# Patient Record
Sex: Female | Born: 1955 | Hispanic: Yes | Marital: Single | Smoking: Never smoker
Health system: Southern US, Community
[De-identification: ages and names within clinical notes are randomized; demographics above are authoritative.]

## PROBLEM LIST (undated history)

## (undated) DIAGNOSIS — E119 Type 2 diabetes mellitus without complications: Secondary | ICD-10-CM

## (undated) DIAGNOSIS — E739 Lactose intolerance, unspecified: Secondary | ICD-10-CM

## (undated) DIAGNOSIS — E785 Hyperlipidemia, unspecified: Secondary | ICD-10-CM

## (undated) DIAGNOSIS — I10 Essential (primary) hypertension: Secondary | ICD-10-CM

## (undated) HISTORY — DX: Type 2 diabetes mellitus without complications: E11.9

## (undated) HISTORY — DX: Essential (primary) hypertension: I10

## (undated) HISTORY — DX: Lactose intolerance, unspecified: E73.9

---

## 2010-09-03 ENCOUNTER — Ambulatory Visit: Payer: Self-pay | Admitting: Family Medicine

## 2011-12-17 ENCOUNTER — Other Ambulatory Visit: Payer: Self-pay | Admitting: Family Medicine

## 2011-12-17 LAB — COMPREHENSIVE METABOLIC PANEL
Albumin: 3.8 g/dL (ref 3.4–5.0)
Alkaline Phosphatase: 89 U/L (ref 50–136)
Co2: 28 mmol/L (ref 21–32)
Creatinine: 0.54 mg/dL — ABNORMAL LOW (ref 0.60–1.30)
EGFR (African American): >60
Osmolality: 279 (ref 275–301)
Potassium: 4 mmol/L (ref 3.5–5.1)
SGOT(AST): 26 U/L (ref 15–37)
Sodium: 142 mmol/L (ref 136–145)
Total Protein: 7.6 g/dL (ref 6.4–8.2)

## 2011-12-17 LAB — CBC WITH DIFFERENTIAL/PLATELET
Basophil #: 0.1 10*3/uL (ref 0.0–0.1)
Basophil %: 1.1 %
Eosinophil #: 0.2 10*3/uL (ref 0.0–0.7)
Eosinophil %: 2.8 %
HCT: 41.5 % (ref 35.0–47.0)
Lymphocyte #: 2.9 10*3/uL (ref 1.0–3.6)
MCH: 28.3 pg (ref 26.0–34.0)
MCV: 86 fL (ref 80–100)
Monocyte #: 0.3 x10 3/mm (ref 0.2–0.9)
Monocyte %: 5.7 %
Neutrophil #: 2.4 10*3/uL (ref 1.4–6.5)
Neutrophil %: 40.9 %
Platelet: 310 10*3/uL (ref 150–440)
RBC: 4.81 10*6/uL (ref 3.80–5.20)
RDW: 14.3 % (ref 11.5–14.5)
WBC: 5.8 10*3/uL (ref 3.6–11.0)

## 2011-12-17 LAB — TSH: Thyroid Stimulating Horm: 0.99 u[IU]/mL

## 2012-03-22 ENCOUNTER — Ambulatory Visit: Payer: Self-pay | Admitting: Unknown Physician Specialty

## 2013-08-27 ENCOUNTER — Ambulatory Visit: Payer: Self-pay | Admitting: Physician Assistant

## 2016-12-07 ENCOUNTER — Ambulatory Visit: Payer: Self-pay | Admitting: Adult Health Nurse Practitioner

## 2016-12-07 VITALS — BP 119/70 | HR 70 | Ht 63.0 in | Wt 179.2 lb

## 2016-12-07 DIAGNOSIS — Z0000 Encounter for general adult medical examination without abnormal findings: Secondary | ICD-10-CM

## 2016-12-07 NOTE — Progress Notes (Signed)
   Subjective:    Patient ID: Anne Floyd, female    DOB: Jul 29, 1955, 61 y.o.   MRN: [id number]  HPI   New pt apt. She was  in the ED for L abdominal pain in Oct 2017. Dr diagnosed as Lactose intolerant.  Denies any hx of HTN, DM, HLD.    There are no active problems to display for this patient.  Allergies as of 12/07/2016   No Known Allergies     Medication List       Accurate as of 12/07/16  6:37 PM. Always use your most recent med list.          multivitamin tablet Take 1 tablet by mouth daily.        Review of Systems  All other systems reviewed and are negative.      Objective:   Physical Exam  Constitutional: She is oriented to person, place, and time. She appears well-developed and well-nourished.  HENT:  Head: Normocephalic and atraumatic.  Eyes: Pupils are equal, round, and reactive to light.  Neck: Normal range of motion. Neck supple.  Cardiovascular: Normal rate, regular rhythm and normal heart sounds.   Pulmonary/Chest: Effort normal and breath sounds normal.  Abdominal: Soft. Bowel sounds are normal.  Neurological: She is alert and oriented to person, place, and time.  Skin: Skin is warm and dry.    BP 119/70 (BP Location: Left Arm, Patient Position: Sitting, Cuff Size: Large)   Pulse 70   Ht _0  (1.6 m)   Wt 179 lb 3.2 oz (81.3 kg)   LMP 12/08/2011 (Approximate)   BMI 31.74 kg/m        Assessment & Floyd:   Labs today: CBC, C Met, TSH, A1C, Lipids  F/u PRN

## 2016-12-08 LAB — TSH: TSH: 1.3 u[IU]/mL (ref 0.450–4.500)

## 2016-12-08 LAB — COMPREHENSIVE METABOLIC PANEL
ALK PHOS: 72 IU/L (ref 39–117)
ALT: 34 IU/L — ABNORMAL HIGH (ref 0–32)
AST: 22 IU/L (ref 0–40)
Albumin/Globulin Ratio: 1.6 (ref 1.2–2.2)
Albumin: 4.2 g/dL (ref 3.6–4.8)
BUN/Creatinine Ratio: 15 (ref 12–28)
BUN: 9 mg/dL (ref 8–27)
Bilirubin Total: 0.3 mg/dL (ref 0.0–1.2)
CO2: 21 mmol/L (ref 20–29)
Calcium: 9.4 mg/dL (ref 8.7–10.3)
Chloride: 107 mmol/L — ABNORMAL HIGH (ref 96–106)
Creatinine, Ser: 0.6 mg/dL (ref 0.57–1.00)
GFR calc Af Amer: 115 mL/min/{1.73_m2} (ref >59)
GFR calc non Af Amer: 99 mL/min/{1.73_m2} (ref >59)
GLUCOSE: 100 mg/dL — AB (ref 65–99)
Globulin, Total: 2.6 g/dL (ref 1.5–4.5)
Potassium: 4.3 mmol/L (ref 3.5–5.2)
Sodium: 144 mmol/L (ref 134–144)
Total Protein: 6.8 g/dL (ref 6.0–8.5)

## 2016-12-08 LAB — LIPID PANEL
CHOLESTEROL TOTAL: 205 mg/dL — AB (ref 100–199)
Chol/HDL Ratio: 4.8 ratio — ABNORMAL HIGH (ref 0.0–4.4)
HDL: 43 mg/dL (ref >39)
LDL Calculated: 103 mg/dL — ABNORMAL HIGH (ref 0–99)
Triglycerides: 294 mg/dL — ABNORMAL HIGH (ref 0–149)
VLDL Cholesterol Cal: 59 mg/dL — ABNORMAL HIGH (ref 5–40)

## 2016-12-08 LAB — HEMOGLOBIN A1C
Est. average glucose Bld gHb Est-mCnc: 123 mg/dL
HEMOGLOBIN A1C: 5.9 % — AB (ref 4.8–5.6)

## 2016-12-08 LAB — CBC
Hematocrit: 39 % (ref 34.0–46.6)
Hemoglobin: 13.3 g/dL (ref 11.1–15.9)
MCH: 28.8 pg (ref 26.6–33.0)
MCHC: 34.1 g/dL (ref 31.5–35.7)
MCV: 84 fL (ref 79–97)
Platelets: 282 10*3/uL (ref 150–379)
RBC: 4.62 x10E6/uL (ref 3.77–5.28)
RDW: 15.2 % (ref 12.3–15.4)
WBC: 6.4 10*3/uL (ref 3.4–10.8)

## 2017-11-16 ENCOUNTER — Encounter: Payer: Self-pay | Admitting: Internal Medicine

## 2017-11-16 ENCOUNTER — Ambulatory Visit: Payer: Medicaid Other | Admitting: Internal Medicine

## 2017-11-16 VITALS — BP 142/78 | HR 71 | Temp 98.2°F | Wt 177.4 lb

## 2017-11-16 DIAGNOSIS — Z0000 Encounter for general adult medical examination without abnormal findings: Secondary | ICD-10-CM

## 2017-11-16 NOTE — Progress Notes (Signed)
   Subjective:    Patient ID: Anne Floyd, female    DOB: [birth year], 62 y.o.   MRN: [id number]  HPI   Pt is here with a chief complaint of concern for retaining water. She notices that her BP is also high when she is retaining fluids. When she uses a tissue, there is water that comes up (nose and mouth). She experiences a lot of swelling in her ankles and does not sleep well (around 5 hours normally).   She has experienced swelling in her legs when she was pregnant in the past. She is not sure why this has been happening recently.   She has had some stress since she started school.     Allergies as of 11/16/2017   No Known Allergies     Medication List        Accurate as of 11/16/17  9:33 AM. Always use your most recent med list.          multivitamin tablet Take 1 tablet by mouth daily.      There are no active problems to display for this patient.   Review of Systems     Objective:   Physical Exam  Constitutional: She is oriented to person, place, and time.  Cardiovascular: Normal rate, regular rhythm and normal heart sounds.  Pulmonary/Chest: Effort normal and breath sounds normal.  Neurological: She is alert and oriented to person, place, and time.   BP (!) 142/78 (BP Location: Left Arm, Patient Position: Sitting)   Pulse 71   Temp 98.2 F (36.8 C) (Oral)   Wt 177 lb 6.4 oz (80.5 kg)   BMI 31.42 kg/m    No fluid/rales in the lungs. Thyroid appears normal. Circulation in legs is normal (no edema or fluid present at this time). No extra fluid under the skin.     Assessment & Plan:   Order labs for today. Pt's symptoms may be suggestive of stress. Advised pt that extra salt in the diet affects fluid retention.   No f/u appt scheduled at this time.

## 2017-11-17 LAB — SPECIMEN STATUS REPORT

## 2017-11-19 LAB — SPECIMEN STATUS REPORT

## 2017-11-19 LAB — SEDIMENTATION RATE: Sed Rate: 2 mm/hr (ref 0–40)

## 2018-01-05 ENCOUNTER — Telehealth: Payer: Self-pay

## 2018-01-05 NOTE — Telephone Encounter (Signed)
Spoke with pt. Explained there was an error with the labs. Asked pt to come back in for repeat labs. Pt did not want to schedule for today. She will call back.

## 2018-02-21 ENCOUNTER — Other Ambulatory Visit: Payer: Medicaid Other

## 2018-02-22 LAB — COMPREHENSIVE METABOLIC PANEL
ALT: 29 IU/L (ref 0–32)
AST: 21 IU/L (ref 0–40)
Albumin/Globulin Ratio: 1.5 (ref 1.2–2.2)
Albumin: 4.2 g/dL (ref 3.6–4.8)
Alkaline Phosphatase: 75 IU/L (ref 39–117)
BUN / CREAT RATIO: 20 (ref 12–28)
BUN: 13 mg/dL (ref 8–27)
CHLORIDE: 106 mmol/L (ref 96–106)
CO2: 22 mmol/L (ref 20–29)
CREATININE: 0.65 mg/dL (ref 0.57–1.00)
Calcium: 9.6 mg/dL (ref 8.7–10.3)
GFR calc Af Amer: 111 mL/min/{1.73_m2} (ref >59)
GFR calc non Af Amer: 96 mL/min/{1.73_m2} (ref >59)
GLUCOSE: 142 mg/dL — AB (ref 65–99)
Globulin, Total: 2.8 g/dL (ref 1.5–4.5)
Potassium: 4.2 mmol/L (ref 3.5–5.2)
Sodium: 143 mmol/L (ref 134–144)
Total Protein: 7 g/dL (ref 6.0–8.5)

## 2018-02-22 LAB — CBC
HEMOGLOBIN: 13.3 g/dL (ref 11.1–15.9)
Hematocrit: 42 % (ref 34.0–46.6)
MCH: 28.1 pg (ref 26.6–33.0)
MCHC: 31.7 g/dL (ref 31.5–35.7)
MCV: 89 fL (ref 79–97)
PLATELETS: 351 10*3/uL (ref 150–450)
RBC: 4.73 x10E6/uL (ref 3.77–5.28)
RDW: 14.7 % (ref 12.3–15.4)
WBC: 7 10*3/uL (ref 3.4–10.8)

## 2018-02-22 LAB — URINALYSIS
Bilirubin, UA: NEGATIVE
GLUCOSE, UA: NEGATIVE
KETONES UA: NEGATIVE
LEUKOCYTES UA: NEGATIVE
NITRITE UA: NEGATIVE
PROTEIN UA: NEGATIVE
RBC UA: NEGATIVE
SPEC GRAV UA: 1.009 (ref 1.005–1.030)
Urobilinogen, Ur: 0.2 mg/dL (ref 0.2–1.0)
pH, UA: 7 (ref 5.0–7.5)

## 2018-02-22 LAB — LIPID PANEL
CHOLESTEROL TOTAL: 195 mg/dL (ref 100–199)
Chol/HDL Ratio: 3.8 ratio (ref 0.0–4.4)
HDL: 51 mg/dL (ref >39)
LDL CALC: 118 mg/dL — AB (ref 0–99)
TRIGLYCERIDES: 132 mg/dL (ref 0–149)
VLDL CHOLESTEROL CAL: 26 mg/dL (ref 5–40)

## 2018-02-22 LAB — TSH: TSH: 1 u[IU]/mL (ref 0.450–4.500)

## 2018-04-18 ENCOUNTER — Ambulatory Visit: Payer: Medicaid Other | Admitting: Adult Health Nurse Practitioner

## 2018-04-18 DIAGNOSIS — M5442 Lumbago with sciatica, left side: Principal | ICD-10-CM

## 2018-04-18 DIAGNOSIS — G8929 Other chronic pain: Secondary | ICD-10-CM

## 2018-04-18 DIAGNOSIS — M545 Low back pain, unspecified: Secondary | ICD-10-CM | POA: Insufficient documentation

## 2018-04-18 NOTE — Progress Notes (Signed)
° °  Subjective:    Patient ID: Anne Floyd, female    DOB: 1955/12/28, 62 y.o.   MRN: [id number]  HPI Anne Floyd is a 62 y.o. female who is here with an interpreter. She states that she's been complaining of sciatic back pain that radiates to her left leg and foot. The pain started 12 years ago when she was diagnosed with nerve impingement, and was treated with exercise. The pain was tolerable and well managed until 3 months ago when she lifted a heavy item, the pain worsened again and was 11/10 in severity and shooting in nature with the radiation to left leg and foot. She states that she still ties to exercise to relief the pain, and the pain has improved to 4/10 now. She states that when the pain worsens, she notices weakness and has to lean on a wall to walk. She also noticed bilateral calf cramps 3 weeks ago, that have been constant on the left leg and increasing in frequency in the right leg. She denies using any painkillers. She denied hematuria or hematochezia or melena. She states that she sometimes experiences epigastric discomfort and acid reflux after eating fatty or spicy foods. Her epigastric discomfort doesn't radiate to her back, when though she sometimes experiences right side pain that she relates to her left leg pain. She reports having high blood pressure that was previously treated with medications given by a cardiologist, who later stopped the medications and recommended aspirin only due to BP fluctuations that causes symptomatic hypotension. She is not currently on any BP medications.   Review of Systems As above    Objective:   Physical Exam General: alert, awake and comfortable MSK: No ROM restriction of back (+) antalgic gait (+) walks with support (+) less muscle bulk/wasting of left leg (+) weakness in LL        Assessment & Plan:  1. Sciatic Back Pain that Radiates to Left Leg -I advised her to continue exercising and start OTC  ibuprofen for her pain. -I advised her to monitor her BP and epigastric discomfort.    This case was discussed and reviewed with NP Doles-Johnson

## 2018-06-06 ENCOUNTER — Ambulatory Visit: Payer: Medicaid Other

## 2019-04-27 ENCOUNTER — Encounter: Payer: Self-pay | Admitting: Emergency Medicine

## 2019-04-27 ENCOUNTER — Other Ambulatory Visit: Payer: Self-pay

## 2019-04-27 ENCOUNTER — Emergency Department
Admission: EM | Admit: 2019-04-27 | Discharge: 2019-04-27 | Disposition: A | Discharge status: Home / Self Care | Payer: Self-pay | Attending: Emergency Medicine | Admitting: Emergency Medicine

## 2019-04-27 DIAGNOSIS — M545 Low back pain, unspecified: Secondary | ICD-10-CM

## 2019-04-27 DIAGNOSIS — I10 Essential (primary) hypertension: Secondary | ICD-10-CM | POA: Insufficient documentation

## 2019-04-27 DIAGNOSIS — G8929 Other chronic pain: Secondary | ICD-10-CM | POA: Insufficient documentation

## 2019-04-27 LAB — URINALYSIS, COMPLETE (UACMP) WITH MICROSCOPIC
Bacteria, UA: NONE SEEN
Bilirubin Urine: NEGATIVE
Glucose, UA: NEGATIVE mg/dL
Hgb urine dipstick: NEGATIVE
Ketones, ur: NEGATIVE mg/dL
Nitrite: NEGATIVE
Protein, ur: NEGATIVE mg/dL
Specific Gravity, Urine: 1.021 (ref 1.005–1.030)
pH: 6 (ref 5.0–8.0)

## 2019-04-27 LAB — CBC
HCT: 40.6 % (ref 36.0–46.0)
Hemoglobin: 13.7 g/dL (ref 12.0–15.0)
MCH: 29 pg (ref 26.0–34.0)
MCHC: 33.7 g/dL (ref 30.0–36.0)
MCV: 85.8 fL (ref 80.0–100.0)
Platelets: 310 10*3/uL (ref 150–400)
RBC: 4.73 MIL/uL (ref 3.87–5.11)
RDW: 14.3 % (ref 11.5–15.5)
WBC: 6.2 10*3/uL (ref 4.0–10.5)
nRBC: 0 % (ref 0.0–0.2)

## 2019-04-27 LAB — TSH: TSH: 0.807 u[IU]/mL (ref 0.350–4.500)

## 2019-04-27 LAB — BASIC METABOLIC PANEL
Anion gap: 9 (ref 5–15)
BUN: 13 mg/dL (ref 8–23)
CO2: 23 mmol/L (ref 22–32)
Calcium: 9.2 mg/dL (ref 8.9–10.3)
Chloride: 107 mmol/L (ref 98–111)
Creatinine, Ser: 0.63 mg/dL (ref 0.44–1.00)
GFR calc Af Amer: >60 mL/min (ref >60)
GFR calc non Af Amer: >60 mL/min (ref >60)
Glucose, Bld: 131 mg/dL — ABNORMAL HIGH (ref 70–99)
Potassium: 3.7 mmol/L (ref 3.5–5.1)
Sodium: 139 mmol/L (ref 135–145)

## 2019-04-27 LAB — T4, FREE: Free T4: 0.79 ng/dL (ref 0.61–1.12)

## 2019-04-27 MED ORDER — NAPROXEN 500 MG PO TABS: 500.0000 mg | ORAL_TABLET | Freq: Two times a day (BID) | ORAL | 0 refills | Status: DC

## 2019-04-27 NOTE — ED Provider Notes (Signed)
[facility] Emergency Department Provider Note  ____________________________________________  Time seen: Approximately 6:23 PM  I have reviewed the triage vital signs and the nursing notes.   HISTORY  Chief Complaint Flank Pain  Encounter completed with Spanish interpreter Rafael at bedside  HPI Anne Floyd is a 63 y.o. female With a past medical history of hypertension who comes to the ED complaining of right flank pain for the past 4 days.  She notes this has been going on for several years but worse in the last few days.  Worse with movement, no alleviating factors.  Nonradiating.  Denies dysuria frequency urgency or hematuria.  She believes that this is due to to a kidney problem.  Her only medication is propranolol which she takes for blood pressure, prescribed by a doctor that she had previously seen in [place].  She also complains of diffuse body swelling.  Denies weight changes, hot or cold intolerance, significant hair loss.  Does not currently have a primary care doctor.      Past Medical History:  Diagnosis Date  . Hypertension   . Lactose intolerance      Patient Active Problem List   Diagnosis Date Noted  . Low back pain 04/18/2018     Past Surgical History:  Procedure Laterality Date  . CESAREAN SECTION     x 5     Prior to Admission medications   Medication Sig Start Date End Date Taking? Authorizing Provider  Multiple Vitamin (MULTIVITAMIN) tablet Take 1 tablet by mouth daily.    [provider]  naproxen (NAPROSYN) 500 MG tablet Take 1 tablet (500 mg total) by mouth 2 (two) times daily with a meal. 04/27/19   Carrie Mew, MD     Allergies Patient has no known allergies.   Family History  Problem Relation Age of Onset  . Heart attack Mother   . Stroke Other   . Hypertension Other     Social History Social History   Tobacco Use  . Smoking status: Never Smoker  . Smokeless tobacco:  Never Used  Substance Use Topics  . Alcohol use: No  . Drug use: No    Review of Systems  Constitutional:   No fever or chills.  ENT:   No sore throat. No rhinorrhea. Cardiovascular:   No chest pain or syncope. Respiratory:   No dyspnea or cough. Gastrointestinal:   Negative for abdominal pain, vomiting and diarrhea.  Musculoskeletal:   Right low back pain as above All other systems reviewed and are negative except as documented above in ROS and HPI.  ____________________________________________   PHYSICAL EXAM:  VITAL SIGNS: ED Triage Vitals  Enc Vitals Group     BP 04/27/19 1430 (!) 126/95     Pulse Rate 04/27/19 1430 64     Resp 04/27/19 1430 16     Temp 04/27/19 1430 98.2 F (36.8 C)     Temp Source 04/27/19 1430 Oral     SpO2 04/27/19 1430 98 %     Weight 04/27/19 1431 180 lb (81.6 kg)     Height 04/27/19 1431 5\' 5"  (1.651 m)     Head Circumference --      Peak Flow --      Pain Score 04/27/19 1430 8     Pain Loc --      Pain Edu? --      Excl. in [place]? --     Vital signs reviewed, nursing assessments reviewed.   Constitutional:  Alert and oriented. Non-toxic appearance. Eyes:   Conjunctivae are normal. EOMI. PERRL. ENT      Head:   Normocephalic and atraumatic.      Nose:   Wearing a mask.      Mouth/Throat:   Wearing a mask.      Neck:   No meningismus. Full ROM.  There is a 2 cm mobile mass in the left lower anterior neck in the thyroid area, nontender, no inflammatory changes.  (Patient reports that this was biopsied 5 years ago and was un concerning.) Hematological/Lymphatic/Immunilogical:   No cervical lymphadenopathy. Cardiovascular:   RRR. Symmetric bilateral radial and DP pulses.  No murmurs. Cap refill less than 2 seconds. Respiratory:   Normal respiratory effort without tachypnea/retractions. Breath sounds are clear and equal bilaterally. No wheezes/rales/rhonchi. Gastrointestinal:   Soft and nontender. Non distended. There is no CVA tenderness.   No rebound, rigidity, or guarding.  Musculoskeletal:   Normal range of motion in all extremities. No joint effusions.  No lower extremity tenderness.  No edema.  Mild tenderness of the right low back musculature.  Rotation of the thorax to the left reproduces her symptoms. Neurologic:   Normal speech and language.  Motor grossly intact. No acute focal neurologic deficits are appreciated.  Skin:    Skin is warm, dry and intact. No rash noted.  No petechiae, purpura, or bullae.  ____________________________________________    LABS (pertinent positives/negatives) (all labs ordered are listed, but only abnormal results are displayed) Labs Reviewed  URINALYSIS, COMPLETE (UACMP) WITH MICROSCOPIC - Abnormal; Notable for the following components:      Result Value   Color, Urine YELLOW (*)    APPearance CLEAR (*)    Leukocytes,Ua TRACE (*)    All other components within normal limits  BASIC METABOLIC PANEL - Abnormal; Notable for the following components:   Glucose, Bld 131 (*)    All other components within normal limits  CBC  T4, FREE  TSH   ____________________________________________   EKG    ____________________________________________    RADIOLOGY  No results found.  ____________________________________________   PROCEDURES Procedures  ____________________________________________    CLINICAL IMPRESSION / ASSESSMENT AND PLAN / ED COURSE  Medications ordered in the ED: Medications - No data to display  Pertinent labs & imaging results that were available during my care of the patient were reviewed by me and considered in my medical decision making (see chart for details).  Anne Floyd was evaluated in Emergency Department on 04/27/2019 for the symptoms described in the history of present illness. She was evaluated in the context of the global COVID-19 pandemic, which necessitated consideration that the patient might be at risk for infection with  the SARS-CoV-2 virus that causes COVID-19. Institutional protocols and algorithms that pertain to the evaluation of patients at risk for COVID-19 are in a state of rapid change based on information released by regulatory bodies including the CDC and federal and state organizations. These policies and algorithms were followed during the patient's care in the ED.   Patient presents with chronic right low back pain, likely musculoskeletal.  Vital signs are normal, labs are normal including thyroid studies. 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  Recommended she obtain primary care follow-up.  NSAIDs for now for pain relief.      ____________________________________________   FINAL CLINICAL IMPRESSION(S) / ED DIAGNOSES    Final diagnoses:  Chronic right-sided low back pain  without sciatica     ED Discharge Orders         Ordered    naproxen (NAPROSYN) 500 MG tablet  2 times daily with meals     04/27/19 1822          Portions of this note were generated with dragon dictation software. Dictation errors may occur despite best attempts at proofreading.   Sharman Cheek, MD 04/27/19 [phone]

## 2019-04-27 NOTE — ED Triage Notes (Signed)
Pt arrives with complaints of right flank pain that started Monday. Pt reports the pain as a sharp pain. No difficulty with urination. Pt also reports nausea.

## 2019-04-27 NOTE — Discharge Instructions (Addendum)
Your lab tests today (including kidney function, electrolytes, blood counts, thyroid function, and urine test) were all normal.  Please follow up with a primary care doctor for continued evaluation of your symptoms.

## 2019-09-25 ENCOUNTER — Other Ambulatory Visit: Payer: Self-pay

## 2019-09-25 ENCOUNTER — Ambulatory Visit: Payer: Medicaid Other | Admitting: Gerontology

## 2019-09-25 ENCOUNTER — Encounter: Payer: Self-pay | Admitting: Gerontology

## 2019-09-25 VITALS — BP 137/77 | HR 71 | Wt 183.0 lb

## 2019-09-25 DIAGNOSIS — S025XXA Fracture of tooth (traumatic), initial encounter for closed fracture: Secondary | ICD-10-CM | POA: Insufficient documentation

## 2019-09-25 DIAGNOSIS — Z0000 Encounter for general adult medical examination without abnormal findings: Secondary | ICD-10-CM
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
## 2019-09-26 ENCOUNTER — Other Ambulatory Visit: Payer: Medicaid Other

## 2019-09-26 ENCOUNTER — Other Ambulatory Visit: Payer: Self-pay

## 2019-09-26 DIAGNOSIS — Z0000 Encounter for general adult medical examination without abnormal findings: Secondary | ICD-10-CM

## 2019-09-27 LAB — LIPID PANEL
Chol/HDL Ratio: 4.7 ratio — ABNORMAL HIGH (ref 0.0–4.4)
Cholesterol, Total: 205 mg/dL — ABNORMAL HIGH (ref 100–199)
HDL: 44 mg/dL (ref >39)
LDL Chol Calc (NIH): 111 mg/dL — ABNORMAL HIGH (ref 0–99)
Triglycerides: 289 mg/dL — ABNORMAL HIGH (ref 0–149)
VLDL Cholesterol Cal: 50 mg/dL — ABNORMAL HIGH (ref 5–40)

## 2019-10-23 ENCOUNTER — Telehealth: Payer: Self-pay | Admitting: Gerontology

## 2019-10-23 NOTE — Telephone Encounter (Signed)
Called pt at 3:54pm and LVM, reminding pt we need to schedule follow up appt - KL

## 2019-11-01 ENCOUNTER — Other Ambulatory Visit: Payer: Self-pay

## 2019-11-01 ENCOUNTER — Ambulatory Visit: Payer: Medicaid Other | Admitting: Gerontology

## 2019-11-01 ENCOUNTER — Encounter: Payer: Self-pay | Admitting: Gerontology

## 2019-11-01 ENCOUNTER — Other Ambulatory Visit: Payer: Medicaid Other

## 2019-11-01 VITALS — BP 119/73 | HR 71 | Temp 97.1°F | Ht 61.0 in | Wt 184.9 lb

## 2019-11-01 DIAGNOSIS — R7303 Prediabetes: Secondary | ICD-10-CM | POA: Insufficient documentation

## 2019-11-01 DIAGNOSIS — I10 Essential (primary) hypertension: Secondary | ICD-10-CM

## 2019-11-01 DIAGNOSIS — S025XXA Fracture of tooth (traumatic), initial encounter for closed fracture: Secondary | ICD-10-CM

## 2019-11-01 LAB — POCT GLYCOSYLATED HEMOGLOBIN (HGB A1C): Hemoglobin A1C: 6.3 % — AB (ref 4.0–5.6)

## 2019-11-01 MED ORDER — CHLORTHALIDONE 25 MG PO TABS: 12.5000 mg | ORAL_TABLET | Freq: Every day | ORAL | 0 refills | Status: DC

## 2019-11-01 MED ORDER — METFORMIN HCL 1000 MG PO TABS: 500.0000 mg | ORAL_TABLET | Freq: Two times a day (BID) | ORAL | 0 refills | Status: DC

## 2019-11-01 NOTE — Patient Instructions (Addendum)
Plan de alimentacin DASH DASH Eating Plan DASH es la sigla en ingls de "Enfoques Alimentarios para Detener la Hipertensin" (Dietary Approaches to Stop Hypertension). El plan de alimentacin DASH ha demostrado bajar la presin arterial elevada (hipertensin). Tambin puede reducir el riesgo de diabetes tipo 2, enfermedad cardaca y accidente cerebrovascular. Este plan tambin puede ayudar a adelgazar. Consejos para seguir este plan  Pautas generales  Evite ingerir ms de 2,300 mg (miligramos) de sal (sodio) por da. Si tiene hipertensin, es posible que necesite reducir la ingesta de sodio a 1,500 mg por da.  Limite el consumo de alcohol a no ms de 1medida por da si es mujer y no est embarazada, y 2medidas por da si es hombre. Una medida equivale a 12oz (355ml) de cerveza, 5oz (148ml) de vino o 1oz (44ml) de bebidas alcohlicas de alta graduacin.  Trabaje con su mdico para mantener un peso saludable o perder peso. Pregntele cul es el peso recomendado para usted.  Realice al menos 30 minutos de ejercicio que haga que se acelere su corazn (ejercicio aerbico) la mayora de los das de la semana. Estas actividades pueden incluir caminar, nadar o andar en bicicleta.  Trabaje con su mdico o especialista en alimentacin y nutricin (nutricionista) para ajustar su plan alimentario a sus necesidades calricas personales. Lectura de las etiquetas de los alimentos   Verifique en las etiquetas de los alimentos, la cantidad de sodio por porcin. Elija alimentos con menos del 5 por ciento del valor diario de sodio. Generalmente, los alimentos con menos de 300 mg de sodio por porcin se encuadran dentro de este plan alimentario.  Para encontrar cereales integrales, busque la palabra "integral" como primera palabra en la lista de ingredientes. De compras  Compre productos en los que en su etiqueta diga: "bajo contenido de sodio" o "sin agregado de sal".  Compre alimentos frescos.  Evite los alimentos enlatados y comidas precocidas o congeladas. Coccin  Evite agregar sal cuando cocine. Use hierbas o aderezos sin sal, en lugar de sal de mesa o sal marina. Consulte al mdico o farmacutico antes de usar sustitutos de la sal.  No fra los alimentos. A la hora de cocinar los alimentos opte por hornearlos, hervirlos, grillarlos y asarlos a la parrilla.  Cocine con aceites cardiosaludables, como oliva, canola, soja o girasol. Planificacin de las comidas  Consuma una dieta equilibrada, que incluya lo siguiente: ? 5o ms porciones de frutas y verduras por da. Trate de que la mitad del plato de cada comida sean frutas y verduras. ? Hasta 6 u 8 porciones de cereales integrales por da. ? Menos de 6 onzas de carne, aves o pescado magros por da. Una porcin de 3 onzas de carne tiene casi el mismo tamao que un mazo de cartas. Un huevo equivale a 1 onza. ? Dos porciones de productos lcteos descremados por da. ? Una porcin de frutos secos, semillas o frijoles 5 veces por semana. ? Grasas cardiosaludables. Las grasas saludables llamadas cidos grasos omega-3 se encuentran en alimentos como semillas de lino y pescados de agua fra, como por ejemplo, sardinas, salmn y caballa.  Limite la cantidad que ingiere de los siguientes alimentos: ? Alimentos enlatados o envasados. ? Alimentos con alto contenido de grasa trans, como alimentos fritos. ? Alimentos con alto contenido de grasa saturada, como carne con grasa. ? Dulces, postres, bebidas azucaradas y otros alimentos con azcar agregada. ? Productos lcteos enteros.  No le agregue sal a los alimentos antes de probarlos.  Trate de comer   al menos 2 comidas vegetarianas por semana.  Consuma ms comida casera y menos de restaurante, de bufs y comida rpida.  Cuando coma en un restaurante, pida que preparen su comida con menos sal o, en lo posible, sin nada de sal. Qu alimentos se recomiendan? Los alimentos enumerados a  continuacin no constituyen una lista completa. Hable con el nutricionista sobre las mejores opciones alimenticias para usted. Cereales Pan de salvado o integral. Pasta de salvado o integral. Arroz integral. Avena. Quinua. Trigo burgol. Cereales integrales y con bajo contenido de sodio. Pan pita. Galletitas de agua con bajo contenido de grasa y sodio. Tortillas de harina integral. Verduras Verduras frescas o congeladas (crudas, al vapor, asadas o grilladas). Jugos de tomate y verduras con bajo contenido de sodio o reducidos en sodio. Salsa y pasta de tomate con bajo contenido de sodio o reducidas en sodio. Verduras enlatadas con bajo contenido de sodio o reducidas en sodio. Frutas Todas las frutas frescas, congeladas o disecadas. Frutas enlatadas en jugo natural (sin agregado de azcar). Carne y otros alimentos proteicos Pollo o pavo sin piel. Carne de pollo o de pavo molida. Cerdo desgrasado. Pescado y mariscos. Claras de huevo. Porotos, guisantes o lentejas secos. Frutos secos, mantequilla de frutos secos y semillas sin sal. Frijoles enlatados sin sal. Cortes de carne vacuna magra, desgrasada. Embutidos magros, con bajo contenido de sodio. Lcteos Leche descremada (1%) o descremada. Quesos sin grasa, con bajo contenido de grasa o descremados. Queso blanco o ricota sin grasa, con bajo contenido de sodio. Yogur semidescremado o descremado. Queso con bajo contenido de grasa y sodio. Grasas y aceites Margarinas untables que no contengan grasas trans. Aceite vegetal. Mayonesa y aderezos para ensaladas livianos o con bajo contenido de grasas (reducidos en sodio). Aceite de canola, crtamo, oliva, soja y girasol. Aguacate. Condimentos y otros alimentos Hierbas. Especias. Mezclas de condimentos sin sal. Palomitas de maz y pretzels sin sal. Dulces con bajo contenido de grasas. Qu alimentos no se recomiendan? Los alimentos enumerados a continuacin no constituyen una lista completa. Hable con el  nutricionista sobre las mejores opciones alimenticias para usted. Cereales Productos de panificacin hechos con grasa, como medialunas, magdalenas y algunos panes. Comidas con arroz o pasta seca listas para usar. Verduras Verduras con crema o fritas. Verduras en salsa de queso. Verduras enlatadas regulares (que no sean con bajo contenido de sodio o reducidas en sodio). Pasta y salsa de tomates enlatadas regulares (que no sean con bajo contenido de sodio o reducidas en sodio). Jugos de tomate y verduras regulares (que no sean con bajo contenido de sodio o reducidos en sodio). Pepinillos. Aceitunas. Frutas Fruta enlatada en almbar liviano o espeso. Frutas cocidas en aceite. Frutas con salsa de crema o manteca. Carne y otros alimentos proteicos Cortes de carne con grasa. Costillas. Carne frita. Tocino. Salchichas. Mortadela y otras carnes procesadas. Salame. Panceta. Perros calientes (hotdogs). Salchicha de cerdo. Frutos secos y semillas con sal. Frijoles enlatados con agregado de sal. Pescado enlatado o ahumado. Huevos enteros o yemas. Pollo o pavo con piel. Lcteos Leche entera o al 2%, crema y mitad leche y mitad crema. Queso crema entero o con toda su grasa. Yogur entero o endulzado. Quesos con toda su grasa. Sustitutos de cremas no lcteas. Coberturas batidas. Quesos para untar y quesos procesados. Grasas y aceites Mantequilla. Margarina en barra. Manteca de cerdo. Materia grasa. Mantequilla clarificada. Grasa de panceta. Aceites tropicales como aceite de coco, palmiste o palma. Condimentos y otros alimentos Palomitas de maz y pretzels con sal. Sal   de cebolla, sal de ajo, sal condimentada, sal de mesa y sal marina. Salsa [place]. Salsa trtara. Salsa barbacoa. Salsa teriyaki. Salsa de soja, incluso la que tiene contenido reducido de [place]. Salsa de carne. Salsas en lata y envasadas. Salsa de pescado. Salsa de [place]. Salsa rosada. Rbano picante envasado. [place]. Mostaza. Saborizantes y  tiernizantes para carne. Caldo en cubitos. Salsa picante y salsa tabasco. Escabeches envasados o ya preparados. Aderezos para tacos prefabricados o envasados. Salsas. Aderezos comunes para ensalada. Dnde encontrar ms informacin:  [place], los Pulmones y Herbalist (National Heart, Lung, and [place]): https://wilson-eaton.com/  Asociacin Estadounidense del Corazn (American Heart Association): www.heart.org Resumen  El plan de alimentacin DASH ha demostrado bajar la presin arterial elevada (hipertensin). Tambin puede reducir [employer] de diabetes tipo 2, enfermedad cardaca y accidente cerebrovascular.  Con el plan de alimentacin DASH, deber limitar el consumo de sal (sodio) a 2,300 mg por da. Si tiene hipertensin, es posible que necesite reducir la ingesta de sodio a 1,500 mg por da.  Cuando siga el plan de alimentacin DASH, trate de comer ms frutas frescas y verduras, cereales integrales, carnes magras, lcteos descremados y grasas cardiosaludables.  Trabaje con su mdico o especialista en alimentacin y nutricin (nutricionista) para ajustar su plan alimentario a sus necesidades calricas personales. Esta informacin no tiene Marine scientist el consejo del mdico. Asegrese de hacerle al mdico cualquier pregunta que tenga. Document Revised: 10/04/2016 Document Reviewed: 10/04/2016 Elsevier Patient Education  [place] de alimentacin para personas con prediabetes Prediabetes Eating Plan La prediabetes es una afeccin que hace que los niveles de azcar en la sangre (glucosa) sean ms altos de lo normal. Esto aumenta el riesgo de tener diabetes. Para prevenir la diabetes, es posible que su mdico le recomiende cambios en la dieta y otros cambios en su estilo de vida que lo ayuden a Scientist, forensic lo siguiente:  Chief Technology Officer los niveles de glucemia.  Mejorar los niveles de [place].  Controlar la presin arterial. El mdico puede  recomendarle que trabaje con un especialista en alimentacin y nutricin (nutricionista) para Higher education careers adviser de comidas ms conveniente para usted. Consejos para seguir este plan: Estilo de vida  Establezca metas para bajar de peso con la ayuda de su equipo de atencin mdica. A la [employer] con prediabetes se les recomienda bajar un 7% de su peso corporal.  Haga ejercicio al menos 70minutos 5das por semana, como mnimo.  Asista a un grupo de apoyo o solicite el apoyo continuo de un consejero de salud mental.  [employer] medicamentos de venta libre y los recetados solamente como se lo haya indicado el mdico. Leer las etiquetas de los alimentos  Lea las etiquetas de los alimentos envasados para controlar la cantidad de grasa, sal (sodio) y azcar que contienen. Evite los alimentos que contengan lo siguiente: ? Grasas saturadas. ? Grasas trans. ? Azcares agregados.  Evite los alimentos que contengan ms de 333miligramos(mg) de sodio por porcin. Limite el consumo diario de sodio a menos de 2300mg  por Training and development officer. De compras  Evite comprar alimentos procesados y preelaborados. Coccin  Cocine con aceite de oliva. No use mantequilla, manteca de cerdo o [employer].  Cocine los alimentos al horno, a la parrilla, asados o hervidos. Evite frerlos. Planificacin de las comidas   Trabaje con el nutricionista para crear un plan de alimentacin que sea adecuado para usted. Esto puede incluir lo siguiente: ? Registro de la cantidad de [employer]  ingiere. Use un registro de alimentos, un cuaderno o una aplicacin mvil para anotar lo que comi en cada comida. ? Uso del ndice glucmico (IG) para planificar las comidas. El ndice [place] con qu rapidez elevar la glucemia un alimento. Elija alimentos con bajo IG. Estos demoran ms en elevar la glucemia.  Considere la posibilidad de seguir Clinical cytogeneticist. Esta dieta incluye lo siguiente: ? Varias porciones  de frutas y verduras frescas por da. ? Pescado al [employer] veces por semana. ? Varias porciones de cereales integrales, frijoles, frutos secos y semillas por da. ? Aceite de Location manager de otras grasas. ? Consumo moderado de alcohol. ? Pequeas cantidades de carnes rojas y lcteos enteros.  Si tiene hipertensin arterial, quizs Secretary/administrator consumo de sodio o seguir una dieta como el plan de alimentacin basado en Enfoques Alimentarios para Detener la Hipertensin (Dietary Approaches to Stop Hypertension, DASH). Este es un plan de alimentacin cuyo objetivo es bajar la hipertensin arterial. Qu alimentos se recomiendan? Es posible que los alimentos incluidos a continuacin no Engineer, drilling. Hable con el nutricionista sobre las mejores opciones alimenticias para usted. Cereales Productos integrales, como panes, galletas, cereales y pastas de salvado o integrales. Avena sin azcar. Trigo burgol. Cebada. Quinua. Arroz integral. Tacos o tortillas de harina de maz o de salvado. Hoover Brunette Deatra James. Espin[place]. Guisantes. Remolachas. Coliflor. Repollo. Brcoli. Zanahorias. Tomates. Calabaza. Christella Noa. Hierbas. Pimienta. Cebollas. Pepinos. Repollitos de Bruselas. Frutas Frutos rojos. Bananas. Manzanas. Naranjas. Uvas. Papaya. Mango. [place]. Kiwi. Pomelo. Cerezas. Carnes y otros alimentos ricos en protenas Mariscos. Carne de ave sin piel. Cortes magros de cerdo y carne de res. Tofu. Huevos. Frutos secos. Frijoles. Lcteos Productos lcteos descremados o semidescremados, como yogur, queso cottage y [place]. [employer] T. Caf. Gaseosas sin azcar o dietticas. Soda. Leche descremada o semidescremada. Productos alternativos a la Vidor, como Artas de soja o de [place]. Grasas y aceites Aceite de Regino Ramirez. Aceite de canola. Aceite de girasol. Aceite de semillas de uva. Aguacate. Nueces. Dulces y postres Pudin sin azcar o con bajo contenido de [place]. Helado y otros postres  congelados sin azcar o con bajo contenido de [place]. Condimentos y otros alimentos Hierbas. Especias sin sodio. Mostaza. Salsa de pepinillos. [place] con bajo contenido de [place] y de International aid/development worker. Salsa barbacoa con bajo contenido de grasa y de azcar. Mayonesa con bajo contenido de grasa o sin grasa. Qu alimentos no se recomiendan? Es posible que los alimentos incluidos a continuacin no Engineer, drilling. Hable con el nutricionista sobre las mejores opciones alimenticias para usted. Cereales Productos elaborados con [place] y [place], como panes, pastas, bocadillos y cereales. Verduras Verduras enlatadas. Verduras congeladas con mantequilla o salsa de crema. [name] Frutas enlatadas al almbar. Carnes y otros alimentos ricos en protenas Cortes de carne con grasa. Carne de ave con piel. Carne empanizada o frita. Carnes procesadas. Lcteos Yogur, queso o [employer] Health. Bebidas Bebidas azucaradas, como t helado dulce y [place]. Grasas y [employer]. Manteca de cerdo. Mantequilla clarificada. Dulces y [employer], como pasteles, pastelitos, galletas dulces y tarta de [place]. Condimentos y otros alimentos Mezclas de especias con sal agregada. [place]. Salsa barbacoa. Mayonesa. Resumen  Para prevenir la diabetes, es posible que Personnel officer en la dieta y otros cambios en su estilo de vida para ayudar a Psychologist, clinical, mejorar los niveles de colesterol y Scientist, physiological presin arterial.  Establezca metas para bajar de peso con la ayuda de [place]  equipo de [employer]. A la [employer] con prediabetes se les recomienda bajar un 7por ciento de su peso corporal.  Considere la posibilidad de seguir una dieta mediterrnea que incluya muchas frutas y verduras frescas, cereales integrales, frijoles, frutos secos, semillas, pescado, carnes magras, lcteos descremados y aceites saludables. Esta informacin no tiene Public house manager el consejo del mdico. Asegrese de hacerle al mdico cualquier pregunta que tenga. Document Revised: 12/27/2016 Document Reviewed: 12/27/2016 Elsevier Patient Education  2020 [employer].
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
## 2019-11-05 LAB — UA/M W/RFLX CULTURE, ROUTINE
Bilirubin, UA: NEGATIVE
Glucose, UA: NEGATIVE
Ketones, UA: NEGATIVE
Nitrite, UA: NEGATIVE
Protein,UA: NEGATIVE
RBC, UA: NEGATIVE
Specific Gravity, UA: 1.005 (ref 1.005–1.030)
Urobilinogen, Ur: 0.2 mg/dL (ref 0.2–1.0)
pH, UA: 7 (ref 5.0–7.5)

## 2019-11-05 LAB — COMPREHENSIVE METABOLIC PANEL
ALT: 30 IU/L (ref 0–32)
AST: 20 IU/L (ref 0–40)
Albumin/Globulin Ratio: 1.5 (ref 1.2–2.2)
Albumin: 4.2 g/dL (ref 3.8–4.8)
Alkaline Phosphatase: 73 IU/L (ref 39–117)
BUN/Creatinine Ratio: 18 (ref 12–28)
BUN: 9 mg/dL (ref 8–27)
Bilirubin Total: 0.3 mg/dL (ref 0.0–1.2)
CO2: 26 mmol/L (ref 20–29)
Calcium: 9.2 mg/dL (ref 8.7–10.3)
Chloride: 106 mmol/L (ref 96–106)
Creatinine, Ser: 0.49 mg/dL — ABNORMAL LOW (ref 0.57–1.00)
GFR calc Af Amer: 120 mL/min/{1.73_m2} (ref >59)
GFR calc non Af Amer: 104 mL/min/{1.73_m2} (ref >59)
Globulin, Total: 2.8 g/dL (ref 1.5–4.5)
Glucose: 84 mg/dL (ref 65–99)
Potassium: 4.6 mmol/L (ref 3.5–5.2)
Sodium: 144 mmol/L (ref 134–144)
Total Protein: 7 g/dL (ref 6.0–8.5)

## 2019-11-05 LAB — URINE CULTURE, REFLEX

## 2019-11-05 LAB — MICROSCOPIC EXAMINATION
Bacteria, UA: NONE SEEN
Casts: NONE SEEN /lpf
RBC, Urine: NONE SEEN /hpf (ref 0–2)

## 2019-11-08 ENCOUNTER — Telehealth: Payer: Self-pay | Admitting: Gerontology

## 2019-11-08 NOTE — Telephone Encounter (Signed)
Called patient to relay lab results.  Patient mentioned symptoms of nausea and feeling generally unwell after taking newly prescribed medication - chlorthalidone and metformin. Patient reported no reoccurrence but was instructed to call back to schedule an appointment if these symptoms persist.

## 2019-11-21 ENCOUNTER — Other Ambulatory Visit: Payer: Medicaid Other

## 2019-11-21 ENCOUNTER — Ambulatory Visit: Payer: Medicaid Other | Admitting: Gerontology

## 2019-12-18 ENCOUNTER — Other Ambulatory Visit: Payer: Self-pay

## 2019-12-18 ENCOUNTER — Ambulatory Visit: Payer: Medicaid Other | Admitting: Pharmacy Technician

## 2019-12-18 DIAGNOSIS — Z79899 Other long term (current) drug therapy: Secondary | ICD-10-CM

## 2019-12-18 NOTE — Progress Notes (Signed)
Patient speaks Spanish.  Interpretation provided by Anderson Malta, ID# [id number] at [facility].  Patient agreed to all terms of the [facility] contract.   Patient approved to receive medication assistance at [facility] until time for re-certification in 6195, and as long as eligibility criteria continues to be met.    [facility]

## 2020-10-16 ENCOUNTER — Other Ambulatory Visit: Payer: Self-pay

## 2020-10-16 ENCOUNTER — Encounter: Payer: Self-pay | Admitting: Gerontology

## 2020-10-16 ENCOUNTER — Ambulatory Visit: Payer: Medicaid Other | Admitting: Gerontology

## 2020-10-16 VITALS — BP 144/80 | HR 63 | Temp 97.7°F | Resp 16 | Ht 63.0 in | Wt 181.2 lb

## 2020-10-16 DIAGNOSIS — K219 Gastro-esophageal reflux disease without esophagitis: Secondary | ICD-10-CM

## 2020-10-16 DIAGNOSIS — I10 Essential (primary) hypertension: Secondary | ICD-10-CM

## 2020-10-16 DIAGNOSIS — G479 Sleep disorder, unspecified: Secondary | ICD-10-CM

## 2020-10-16 DIAGNOSIS — R5383 Other fatigue: Secondary | ICD-10-CM

## 2020-10-16 DIAGNOSIS — R7303 Prediabetes: Secondary | ICD-10-CM

## 2020-10-16 DIAGNOSIS — Z0000 Encounter for general adult medical examination without abnormal findings: Secondary | ICD-10-CM

## 2020-10-16 DIAGNOSIS — E7849 Other hyperlipidemia: Secondary | ICD-10-CM

## 2020-10-16 DIAGNOSIS — E785 Hyperlipidemia, unspecified: Secondary | ICD-10-CM | POA: Insufficient documentation

## 2020-10-16 MED ORDER — LISINOPRIL 5 MG PO TABS
5.0000 mg | ORAL_TABLET | Freq: Every day | ORAL | 0 refills | Status: DC
  Filled 2020-10-16: qty 30, 30d supply, fill #0

## 2020-10-16 MED ORDER — METFORMIN HCL 500 MG PO TABS
500.0000 mg | ORAL_TABLET | Freq: Two times a day (BID) | ORAL | 0 refills | Status: DC
  Filled 2020-10-16: qty 60, 30d supply, fill #0
  Filled 2020-10-16: qty 30, 15d supply, fill #0

## 2020-10-16 MED ORDER — OMEPRAZOLE 20 MG PO CPDR
20.0000 mg | DELAYED_RELEASE_CAPSULE | Freq: Every day | ORAL | 0 refills | Status: DC
  Filled 2020-10-16: qty 30, 30d supply, fill #0

## 2020-10-16 MED ORDER — MELATONIN 3 MG PO TABS
3.0000 mg | ORAL_TABLET | Freq: Every evening | ORAL | 0 refills | Status: DC | PRN
  Filled 2020-10-16: qty 30, 30d supply, fill #0
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
## 2020-10-16 NOTE — Patient Instructions (Addendum)
Enfermedad de reflujo gastroesofgico en los adultos Gastroesophageal Reflux Disease, Adult  El reflujo gastroesofgico (RGE) ocurre cuando el cido del estmago sube por el tubo que conecta la boca con el estmago (esfago). Normalmente, la comida baja por el esfago y se mantiene en el estmago, donde se la digiere. Cuando una persona tiene RGE, los alimentos y el cido estomacal suelen volver al esfago. Usted puede tener una enfermedad llamada enfermedad de reflujo gastroesofgico (ERGE) si el reflujo:  Sucede a menudo.  Causa sntomas frecuentes o muy intensos.  Causa problemas tales como dao en el esfago. Cuando esto ocurre, el esfago duele y se hincha. Con el tiempo, la ERGE puede ocasionar pequeos agujeros (lceras) en el revestimiento del esfago. Cules son las causas? Esta afeccin se debe a un problema en el msculo que se encuentra entre el esfago y [place]. Cuando este msculo est dbil o no es normal, no se cierra correctamente para impedir que los alimentos y el cido regresen del Paramedic. El msculo puede debilitarse debido a lo siguiente:  El consumo de [place].  [place].  Tener cierto tipo de hernia (hernia de hiato).  Consumo de alcohol.  Ciertos alimentos y bebidas, como caf, chocolate, cebollas y [place]. Qu incrementa el riesgo?  Tener sobrepeso.  Tener una enfermedad que afecta el tejido conjuntivo.  Tomar antiinflamatorios no esteroideos (AINE), como el ibuprofeno. Cules son los signos o sntomas?  Acidez estomacal.  Dificultad o dolor al tragar.  Sensacin de Best boy un bulto en la garganta.  Sabor amargo en la boca.  Mal aliento.  Tener una gran cantidad de saliva.  Estmago inflamado o con Tree surgeon.  Eructos.  Dolor en el pecho. El dolor de pecho puede deberse a distintas afecciones. Asegrese de Teacher, adult education a su mdico si tiene Tourist information centre manager.  Dificultad para respirar o sibilancias.  Ardelia Mems tos a largo plazo o tos  nocturna.  Desgaste de la superficie de los dientes (esmalte dental).  Prdida de peso. Cmo se trata?  Realizar cambios en la dieta.  Tomar medicamentos.  Someterse a [place]. El tratamiento depender de la gravedad de los sntomas. Siga estas instrucciones en su casa: Comida y bebida  Siga una dieta como se lo haya indicado el mdico. Es posible que deba evitar alimentos y bebidas, por ejemplo: ? Caf y t negro, con o sin cafena. ? Bebidas que contengan alcohol. ? Bebidas energticas y deportivas. ? Bebidas gaseosas (carbonatadas) y refrescos. ? Chocolate y cacao. ? Menta y [place]. ? Ajo y cebolla. ? Rbano picante. ? Alimentos cidos y condimentados. Estos incluyen todos los tipos de pimientos, [place] en polvo, curry en polvo, vinagre, salsas picantes y [employer]. ? Ctricos y sus jugos, por ejemplo, naranjas, limones y limas. ? Alimentos que [employer]. Estos incluyen salsa roja, [place], salsa picante y pizza con salsa de Sublimity. ? Alimentos fritos y Radio broadcast assistant. Estos incluyen donas, papas fritas, papitas fritas de bolsa y aderezos con alto contenido de [place]. ? Carnes con alto contenido de [place]. Estas incluye los perros calientes, chuletas o costillas, embutidos, jamn y tocino. ? Productos lcteos ricos en grasas, como leche [place], [place] y [place] crema.  Consuma pequeas cantidades de comida con ms frecuencia. Evite consumir porciones abundantes.  Evite beber grandes cantidades de lquidos con las comidas.  Evite comer 2 o 3horas antes de acostarse.  Evite recostarse inmediatamente despus de comer.  No haga ejercicios enseguida despus de comer.   Estilo de vida  No fume ni consuma  ningn producto que contenga nicotina o tabaco. Si necesita ayuda para dejar de consumir estos productos, consulte al American Express.  Intente reducir el nivel de estrs. Si necesita ayuda para hacer esto, consulte al mdico.  Si tiene sobrepeso, baje una cantidad de  peso saludable para usted. Consulte a su mdico para bajar de peso de [employer].   Instrucciones generales  Est atento a cualquier cambio en los sntomas.  Tome los medicamentos de venta libre y los recetados solamente como se lo haya indicado el mdico.  No tome aspirina, ibuprofeno ni otros antiinflamatorios no esteroideos (AINE) a menos que el mdico lo autorice.  Use ropa holgada. No use nada apretado alrededor de la cintura.  Levante (eleve) la cabecera de la cama aproximadamente 6pulgadas (15cm). Para hacerlo, es posible que tenga que utilizar una cua.  Evite inclinarse si al hacerlo empeoran los sntomas.  Cumpla con todas las visitas de seguimiento. Comunquese con un mdico si:  Aparecen nuevos sntomas.  Adelgaza y no sabe por qu.  Tiene problemas para tragar o le duele cuando traga.  Tiene sibilancias o tos persistente.  Tiene la voz ronca.  Los sntomas no mejoran con Scientist, research (medical). Solicite ayuda de inmediato si:  Electronics engineer repentino [employer], el cuello, la [place], los dientes o la espalda.  De repente se siente transpirado, mareado o aturdido.  Siente falta de aire o Journalist, newspaper.  Vomita y el vmito es de color verde, amarillo o negro, o tiene un aspecto similar a la sangre o a los posos de caf.  Se desmaya.  Las heces (deposiciones) son rojas, sanguinolentas o negras.  No puede tragar, beber o comer. Estos sntomas pueden representar un problema grave que constituye Radio broadcast assistant. No espere a ver si los sntomas desaparecen. Solicite atencin mdica de inmediato. Comunquese con el servicio de emergencias de su localidad (911 en los [place]). No conduzca por sus propios medios [employer]. Resumen  Si una persona tiene enfermedad de reflujo gastroesofgico (ERGE), los alimentos y el cido estomacal suben al esfago y causan sntomas o problemas tales como dao en el esfago.  El tratamiento depender de la  gravedad de los sntomas.  Siga una dieta como se lo haya indicado el mdico.  Use todos los medicamentos solamente como se lo haya indicado el mdico. Esta informacin no tiene Theme park manager el consejo del mdico. Asegrese de hacerle al mdico cualquier pregunta que tenga. Document Revised: 01/25/2020 Document Reviewed: 01/25/2020 Elsevier Patient Education  2021 Elsevier Inc. [email or web address]">  Plan de alimentacin DASH DASH Eating Plan DASH es la sigla en ingls de "Enfoques Alimentarios para Detener la Hipertensin". El plan de alimentacin DASH ha demostrado:  Bajar la presin arterial elevada (hipertensin).  Reducir el riesgo de diabetes tipo 2, enfermedad cardaca y accidente cerebrovascular.  Ayudar a perder peso. Consejos para seguir Consulting civil engineer las etiquetas de los alimentos  Verifique la cantidad de sal (sodio) por porcin en las etiquetas de los alimentos. Elija alimentos con menos del 5 por ciento del valor diario de sodio. Generalmente, los alimentos con menos de 300 miligramos (mg) de sodio por porcin se encuadran dentro de este plan alimentario.  Para encontrar cereales integrales, busque la palabra "integral" como primera palabra en la lista de ingredientes. Al ir de compras  Compre productos en los que en su etiqueta diga: "bajo contenido de sodio" o "sin agregado de sal".  Compre alimentos frescos. Evite los alimentos enlatados y comidas precocidas o congeladas.  Al cocinar  Evite agregar sal cuando cocine. Use hierbas o aderezos sin sal, en lugar de sal de mesa o sal marina. Consulte al mdico o farmacutico antes de usar sustitutos de la sal.  No fra los alimentos. A la hora de cocinar los alimentos opte por hornearlos, hervirlos, grillarlos, asarlos al horno y asarlos a Patent attorney.  Cocine con aceites cardiosaludables, como oliva, canola, aguacate, soja o girasol. Planificacin de las  comidas  Consuma una dieta equilibrada, que incluya lo siguiente: ? 4o ms porciones de frutas y 4 o ms porciones de Warehouse manager. Trate de que medio plato de cada comida sea de frutas y verduras. ? De 6 a 8porciones de cereales [employer]. ? Menos de 6 onzas (170g) de carne, aves o pescado Copy. Una porcin de 3 onzas (85g) de carne tiene casi el mismo tamao que un mazo de cartas. Un huevo equivale a 1 onza (28g). ? De 2 a 3 porciones de productos lcteos descremados por da. Una porcin es 1taza ( ). ? 1 porcin de frutos secos, semillas o frijoles 5 veces por semana. ? De 2 a 3 porciones de grasas cardiosaludables. Las grasas saludables llamadas cidos grasos omega-3 se encuentran en alimentos como las nueces, las semillas de [place], las leches fortificadas y [place]. Estas grasas tambin se encuentran en los pescados de agua fra, como la sardina, el salmn y la caballa.  Limite la cantidad que consume de: ? Alimentos enlatados o envasados. ? Alimentos con alto contenido de grasa trans, como algunos alimentos fritos. ? Alimentos con alto contenido de grasa saturada, como carne con grasa. ? Postres y otros dulces, bebidas azucaradas y otros alimentos con azcar agregada. ? Productos lcteos enteros.  No le agregue sal a los alimentos antes de probarlos.  No coma ms de 4 yemas de huevo por semana.  Trate de comer al menos 2 comidas vegetarianas por semana.  Consuma ms comida casera y menos de restaurante, de bares y comida rpida.   Estilo de vida  Cuando coma en un restaurante, pida que preparen su comida con menos sal o, en lo posible, sin nada de sal.  Si bebe alcohol: ? Limite la cantidad que bebe:  De 0 a 1 medida por da para las mujeres que no estn embarazadas.  De 0 a 2 medidas por da para los hombres. ? Est atento a la cantidad de alcohol que hay en las bebidas que toma. En los [place], una medida equivale a una  botella de cerveza de 12oz ( ), un vaso de vino de 5oz ( ) o un vaso de una bebida alcohlica de alta graduacin de 1oz (44ml). Informacin general  Evite ingerir ms de 2300 mg de sal por da. Si tiene hipertensin, es posible que necesite reducir la ingesta de sodio a 1,500 mg por da.  Trabaje con su mdico para mantener un peso saludable o perder [employer]. Pregntele cul es el peso recomendado para usted.  Realice al menos 30 minutos de ejercicio que haga que se acelere su corazn (ejercicio Magazine features editor) la [employer]V de la [place]. Estas actividades pueden incluir caminar, nadar o andar en bicicleta.  Trabaje con su mdico o nutricionista para ajustar su plan alimentario a sus necesidades calricas personales. Qu alimentos debo comer? Frutas Todas las frutas frescas, congeladas o disecadas. Frutas enlatadas en jugo natural (sin agregado de azcar). Verduras Verduras frescas o congeladas (crudas, al vapor, asadas o grilladas). Jugos de tomate y verduras  con bajo contenido de sodio o reducidos en sodio. Salsa y pasta de tomate con bajo contenido de sodio o reducidas en sodio. Verduras enlatadas con bajo contenido de sodio o reducidas en sodio. Granos Pan de salvado o integral. Pasta de salvado o integral. Arroz integral. Avena. Quinua. Trigo burgol. Cereales integrales y con bajo contenido de [place]. Pan pita. Galletitas de [place] con bajo contenido de [place] (the [place] of 60 deg S) y [place]. Tortillas de [place] integral. Carnes y otras protenas Pollo o pavo sin piel. Carne de pollo o de [place]. Cerdo desgrasado. Pescado y [employer]. Claras de huevo. Porotos, guisantes o lentejas secos. Frutos secos, mantequilla de frutos secos y semillas sin sal. Frijoles enlatados sin sal. Cortes de carne vacuna magra, desgrasada. Carne precocida o curada magra y baja en sodio, como embutidos o panes de carne. Lcteos Leche descremada (1%) o descremada. Quesos reducidos en grasa, con bajo contenido de grasa o  descremados. Queso blanco o ricota sin grasa, con bajo contenido de [place]. Yogur semidescremado o descremado. Queso con bajo contenido de [place]the [place] of 60 deg [place]) y [place]. Grasas y [employer] untables que no contengan grasas trans. Aceite vegetal. Jerolyn Shin y aderezos para ensaladas livianos, reducidos en grasa o con bajo contenido de grasas (reducidos en sodio). Aceite de canola, crtamo, oliva, aguacate, soja y [place]. Aguacate. Alios y condimentos Hierbas. Especias. Mezclas de condimentos sin sal. Otros alimentos Palomitas de maz y pretzels sin sal. Dulces con bajo contenido de grasas. Es posible que los productos que se enumeran ms [place] no constituyan una lista completa de los alimentos y las bebidas que puede tomar. Consulte a un nutricionista para obtener ms informacin. Qu alimentos debo evitar? [name] Fruta enlatada en almbar liviano o espeso. Frutas cocidas en aceite. Frutas con salsa de crema o mantequilla. Verduras Verduras con crema o fritas. Verduras en salsa de [place]. Verduras enlatadas regulares (que no sean con bajo contenido de sodio o reducidas en sodio). Pasta y salsa de tomates enlatadas regulares (que no sean con bajo contenido de sodio o reducidas en sodio). Jugos de tomate y verduras regulares (que no sean con bajo contenido de sodio o reducidos en sodio). Pepinillos. Aceitunas. Granos Productos de panificacin hechos con grasa, como medialunas, magdalenas y algunos panes. Comidas con arroz o pasta seca listas para usar. Carnes y [address] de carne con alto contenido de Holiday representative. Costillas. Carne frita. [place]. [place], salame y otras carnes precocidas o curadas, como embutidos o panes de carne. Grasa de la espalda del cerdo (panceta). Salchicha de cerdo. Frutos secos y semillas con sal. Frijoles enlatados con agregado de sal. Pescado enlatado o ahumado. Huevos enteros o yemas. Pollo o pavo con piel. Lcteos Leche entera o al 2%, crema y [address] y mitad crema.  Queso crema entero o con toda su grasa. Yogur entero o endulzado. Quesos con toda su grasa. Sustitutos de cremas no lcteas. Coberturas batidas. Quesos para untar y quesos procesados. Grasas y [employer]. Margarina en barra. Manteca de cerdo. Lardo. Mantequilla clarificada. Grasa de panceta. Aceites tropicales como aceite de coco, palmiste o palma. Alios y condimentos Sal de cebolla, sal de ajo, sal condimentada, sal de mesa y sal marina. Salsa [place]shire. Salsa trtara. Salsa barbacoa. Salsa teriyaki. Salsa de soja, incluso la que tiene contenido reducido de [place]. Salsa de carne. Salsas en lata y envasadas. Salsa de pescado. Salsa de [place]. Salsa rosada. Rbanos picantes comprados en tiendas. [place]. [place]. Saborizantes y tiernizantes para carne. Caldo en cubitos. Salsas picantes. Adobos preelaborados o envasados. Aderezos para tacos preelaborados o envasados.  Salsas de pepinillos. Aderezos comunes para ensalada. Otros alimentos Palomitas de maz y pretzels con sal. Es posible que los productos que se enumeran ms arriba no constituyan una lista completa de los alimentos y las bebidas que Personnel officer. Consulte a un nutricionista para obtener ms informacin. Dnde buscar ms informacin  National Heart, Lung, and Blood Institute (Instituto Nacional del [place], los Pulmones y Risk manager): PopSteam.is  American Heart Association (Asociacin Estadounidense del Corazn): www.heart.org  Academy of Nutrition and Dietetics (Academia de Nutricin y Pension scheme manager): www.eatright.org  National Kidney Foundation (Fundacin Nacional del Rin): www.kidney.org Resumen  El plan de alimentacin DASH ha demostrado bajar la presin arterial elevada (hipertensin). Tambin puede reducir [employer] International de diabetes tipo 2, enfermedad cardaca y accidente cerebrovascular.  Cuando siga el plan de alimentacin DASH, trate de comer ms frutas frescas y verduras, cereales integrales, carnes magras,  lcteos descremados y grasas cardiosaludables.  Con el plan de alimentacin DASH, deber limitar el consumo de sal (sodio) a 2,300 mg por da. Si tiene hipertensin, es posible que necesite reducir la ingesta de sodio a 1,500 mg por da.  Trabaje con su mdico o nutricionista para ajustar su plan alimentario a sus necesidades calricas personales. Esta informacin no tiene Theme park manager el consejo del mdico. Asegrese de hacerle al mdico cualquier pregunta que tenga. Document Revised: 07/19/2019 Document Reviewed: 07/19/2019 Elsevier Patient Education  2021 Elsevier Inc. https://www.diabeteseducator.org/docs/default-source/living-with-diabetes/conquering-the-grocery-store-v1.pdf?sfvrsn=4">  Recuento de carbohidratos para la diabetes mellitus en los adultos Carbohydrate Counting for Diabetes Mellitus, Adult El recuento de carbohidratos es un mtodo para llevar un registro de la cantidad de carbohidratos que se ingieren. La ingesta natural de carbohidratos aumenta la cantidad de azcar (glucosa) en la sangre. El recuento de la cantidad de carbohidratos que se ingieren mejora el control del nivel de [place], lo que ayuda a Company secretary la diabetes. Es importante saber la cantidad de carbohidratos que se pueden ingerir en cada comida sin correr Surveyor, minerals. Esto es Government social research officer. Un nutricionista puede ayudarlo a crear un plan de alimentacin y a calcular la cantidad de carbohidratos que debe ingerir en cada comida y colacin. Qu alimentos contienen carbohidratos? Los siguientes alimentos incluyen carbohidratos:  Granos, como panes y cereales.  Frijoles secos y productos con soja.  Verduras con almidn, como papas, guisantes y maz.  [name] y jugos de frutas.  Leche y Dentist.  Dulces y colaciones, como pasteles, galletas, caramelos, papas fritas de bolsa y refrescos.   Cmo se calculan los carbohidratos de los alimentos? Hay dos maneras de calcular los carbohidratos de  los alimentos. Puede leer las etiquetas de los alimentos o aprender cules son los tamaos de las porciones estndar de los alimentos. Puede usar cualquiera de [name] o [place] combinacin de [place]. Usar la Air cabin crew de informacin nutricional La lista de informacin nutricional est incluida en las etiquetas de casi todas las bebidas y los alimentos envasados de los [place]. Incluye lo siguiente:  El tamao de la porcin.  Informacin sobre los nutrientes de cada porcin, incluidos los gramos (g) de carbohidratos por porcin. Para usar la informacin nutricional:  Decida cuntas porciones va a comer.  Multiplique la cantidad de porciones por el nmero de carbohidratos por porcin.  El resultado es la cantidad total de carbohidratos que comer. Conocer los tamaos de las porciones estndar de los alimentos Cuando coma alimentos que contengan carbohidratos y que no estn envasados o no incluyan la informacin nutricional en la etiqueta, debe medir las porciones para poder calcular la  cantidad de carbohidratos.  Mida los alimentos que comer con una balanza de alimentos o una taza medidora, si es necesario.  Decida cuntas porciones de [name], systems.  Multiplique el nmero de porciones por15. En los alimentos que contienen carbohidratos, una porcin [place] a 15 g de carbohidratos. ? Por ejemplo, si come 2 tazas o 10onzas (300g) de fresas, habr comido 2porciones y 30g de carbohidratos (2porciones x 15g=30g).  En el caso de las comidas que contienen mezclas de ms de un alimento, como las sopas y los guisos, debe calcular los carbohidratos de cada alimento que se incluye. La siguiente lista contiene los tamaos de porciones estndar de los alimentos ricos en carbohidratos ms comunes. Cada una de estas porciones tiene aproximadamente 15g de carbohidratos:  1rebanada de pan.  1tortilla de seis pulgadas (15cm).  ? de taza o 2onzas (53g) de arroz o pasta  cocidos.   taza o 3 onzas (85 g) de lentejas o frijoles cocidos o enlatados, escurridos y enjuagados.   taza o 3onzas (85g) de verduras con almidn, como guisantes, maz o zapallo.   taza o 4 onzas (120 g) de cereal caliente.   taza o 3 onzas (85 g) de papas hervidas o en pur, o  o 3 onzas (85 g) de una papa grande al horno.   taza o 4 onzas fluidas ( ) de jugo de frutas.  1 taza u 8 onzas fluidas (237 ml) de leche.  1 unidad pequea o 4 onzas (106 g) de manzana.   unidad o 2 onzas (63 g) de una banana mediana.  1 taza o 5 oz (150 g) de fresas.  3 tazas o 1 oz (24g) de palomitas de maz. Cul sera un ejemplo de recuento de carbohidratos? Para calcular el nmero de carbohidratos de este ejemplo de comida, siga los pasos que se describen a continuacin. Ejemplo de comida  3 onzas (85g) de pechugas de pollo.  ? de taza o 4 onzas (106 g) de arroz integral.   taza o 3 onzas (85 g) de maz.  1 taza u 8 onzas fluidas (237 ml) de leche.  1 taza o 5onzas (150g) de fresas con crema batida sin azcar. Clculo de carbohidratos 1. Identifique los alimentos que contienen carbohidratos: ? Arroz. ? Maz. ? Leche. ? Jinny Sanders. 2. Calcule cuntas porciones come de cada alimento: ? 2 porciones de arroz. ? 1 porcin de maz. ? 1 porcin de leche. ? 1 porcin de fresas. 3. Multiplique cada nmero de porciones por 15g: ? 2 porciones de arroz x 15 g = 30 g. ? 1 porcin de maz x 15 g = 15 g. ? 1 porcin de leche x 15 g = 15 g. ? 1 porcin de fresas x 15 g = 15 g. 4. Sume todas las cantidades para conocer el total de gramos de carbohidratos consumidos: ? 30g + 15g + 15g + 15g = 75g de carbohidratos en total. Consejos para seguir este plan Al ir de compras  Elabore un plan de comidas y luego haga una lista de compras.  Compre verduras frescas y congeladas, frutas frescas y congeladas, productos lcteos, huevos, frijoles, lentejas y cereales  integrales.  Fjese en las etiquetas de los alimentos. Elija los alimentos que tengan ms fibra y Neurosurgeon.  Evite los alimentos procesados y los alimentos con [name], clinical. Planificacin de las comidas  Trate de consumir la misma cantidad de carbohidratos en cada comida y en cada colacin.  Planifique tomar comidas y colaciones regulares y equilibradas.  Dnde buscar ms informacin  American Diabetes Association (Asociacin Estadounidense de la Diabetes): www.diabetes.org  Centers for Disease Control and Prevention (Centros para el Control y la Prevencin de Event organisernfermedades): FootballExhibition.com.brwww.cdc.gov Resumen  El recuento de carbohidratos es un mtodo para llevar un registro de la cantidad de carbohidratos que se ingieren.  La ingesta natural de carbohidratos aumenta la cantidad de azcar (glucosa) en la sangre.  El recuento de la cantidad de carbohidratos que se ingieren mejora el control del nivel de Ridgevilleglucemia, lo que ayuda a Company secretarymanejar la diabetes.  Un nutricionista puede ayudarlo a crear un plan de alimentacin y a calcular la cantidad de carbohidratos que debe ingerir en cada comida y colacin. Esta informacin no tiene Theme park managercomo fin reemplazar el consejo del mdico. Asegrese de hacerle al mdico cualquier pregunta que tenga. Document Revised: 07/23/2019 Document Reviewed: 07/23/2019 Elsevier Patient Education  2021 [employer]

## 2020-10-22 ENCOUNTER — Other Ambulatory Visit: Payer: Self-pay

## 2020-10-22 ENCOUNTER — Other Ambulatory Visit: Payer: Medicaid Other

## 2020-10-22 DIAGNOSIS — R7303 Prediabetes: Secondary | ICD-10-CM

## 2020-10-22 DIAGNOSIS — I10 Essential (primary) hypertension: Secondary | ICD-10-CM

## 2020-10-22 DIAGNOSIS — R5383 Other fatigue: Secondary | ICD-10-CM

## 2020-10-22 DIAGNOSIS — Z0000 Encounter for general adult medical examination without abnormal findings: Secondary | ICD-10-CM

## 2020-10-23 LAB — CBC WITH DIFFERENTIAL/PLATELET
Basophils Absolute: 0.1 x10E3/uL (ref 0.0–0.2)
Basos: 1 %
EOS (ABSOLUTE): 0.2 x10E3/uL (ref 0.0–0.4)
Eos: 4 %
Hematocrit: 43.2 % (ref 34.0–46.6)
Hemoglobin: 14.3 g/dL (ref 11.1–15.9)
Immature Grans (Abs): 0 x10E3/uL (ref 0.0–0.1)
Immature Granulocytes: 0 %
Lymphocytes Absolute: 2.3 x10E3/uL (ref 0.7–3.1)
Lymphs: 40 %
MCH: 28.9 pg (ref 26.6–33.0)
MCHC: 33.1 g/dL (ref 31.5–35.7)
MCV: 87 fL (ref 79–97)
Monocytes Absolute: 0.5 x10E3/uL (ref 0.1–0.9)
Monocytes: 8 %
Neutrophils Absolute: 2.8 x10E3/uL (ref 1.4–7.0)
Neutrophils: 47 %
Platelets: 252 x10E3/uL (ref 150–450)
RBC: 4.94 x10E6/uL (ref 3.77–5.28)
RDW: 14.1 % (ref 11.7–15.4)
WBC: 5.9 x10E3/uL (ref 3.4–10.8)

## 2020-10-23 LAB — COMPREHENSIVE METABOLIC PANEL
ALT: 29 IU/L (ref 0–32)
AST: 21 IU/L (ref 0–40)
Albumin/Globulin Ratio: 1.4 (ref 1.2–2.2)
Albumin: 4.1 g/dL (ref 3.8–4.8)
Alkaline Phosphatase: 72 IU/L (ref 44–121)
BUN/Creatinine Ratio: 14 (ref 12–28)
BUN: 9 mg/dL (ref 8–27)
Bilirubin Total: 0.5 mg/dL (ref 0.0–1.2)
CO2: 20 mmol/L (ref 20–29)
Calcium: 8.9 mg/dL (ref 8.7–10.3)
Chloride: 107 mmol/L — ABNORMAL HIGH (ref 96–106)
Creatinine, Ser: 0.63 mg/dL (ref 0.57–1.00)
Globulin, Total: 2.9 g/dL (ref 1.5–4.5)
Glucose: 108 mg/dL — ABNORMAL HIGH (ref 65–99)
Potassium: 4.5 mmol/L (ref 3.5–5.2)
Sodium: 141 mmol/L (ref 134–144)
Total Protein: 7 g/dL (ref 6.0–8.5)
eGFR: 99 mL/min/{1.73_m2} (ref >59)

## 2020-10-23 LAB — LIPID PANEL
Chol/HDL Ratio: 4.1 ratio (ref 0.0–4.4)
Cholesterol, Total: 173 mg/dL (ref 100–199)
HDL: 42 mg/dL (ref >39)
LDL Chol Calc (NIH): 102 mg/dL — ABNORMAL HIGH (ref 0–99)
Triglycerides: 168 mg/dL — ABNORMAL HIGH (ref 0–149)
VLDL Cholesterol Cal: 29 mg/dL (ref 5–40)

## 2020-10-23 LAB — VITAMIN B12: Vitamin B-12: 645 pg/mL (ref 232–1245)

## 2020-10-23 LAB — VITAMIN D 25 HYDROXY (VIT D DEFICIENCY, FRACTURES): Vit D, 25-Hydroxy: 19 ng/mL — ABNORMAL LOW (ref 30.0–100.0)

## 2020-10-23 LAB — HEMOGLOBIN A1C
Est. average glucose Bld gHb Est-mCnc: 143 mg/dL
Hgb A1c MFr Bld: 6.6 % — ABNORMAL HIGH (ref 4.8–5.6)

## 2020-10-23 LAB — TSH: TSH: 1.28 u[IU]/mL (ref 0.450–4.500)

## 2020-11-06 ENCOUNTER — Ambulatory Visit: Payer: Medicaid Other | Admitting: Gerontology

## 2020-11-06 ENCOUNTER — Other Ambulatory Visit: Payer: Self-pay

## 2020-11-06 ENCOUNTER — Encounter: Payer: Self-pay | Admitting: Gerontology

## 2020-11-06 VITALS — BP 119/75 | HR 61 | Temp 97.7°F | Resp 16 | Ht 63.0 in | Wt 180.2 lb

## 2020-11-06 DIAGNOSIS — E559 Vitamin D deficiency, unspecified: Secondary | ICD-10-CM

## 2020-11-06 DIAGNOSIS — I10 Essential (primary) hypertension: Secondary | ICD-10-CM

## 2020-11-06 DIAGNOSIS — E119 Type 2 diabetes mellitus without complications: Secondary | ICD-10-CM

## 2020-11-06 DIAGNOSIS — R7303 Prediabetes: Secondary | ICD-10-CM

## 2020-11-06 DIAGNOSIS — E7849 Other hyperlipidemia: Secondary | ICD-10-CM

## 2020-11-06 MED ORDER — VITAMIN D (ERGOCALCIFEROL) 1.25 MG (50000 UNIT) PO CAPS
50000.0000 [IU] | ORAL_CAPSULE | ORAL | 0 refills | Status: DC
  Filled 2020-11-06: qty 2, 14d supply, fill #0
  Filled 2020-11-20: qty 4, 28d supply, fill #1

## 2020-11-06 MED ORDER — PROPRANOLOL HCL 20 MG PO TABS
20.0000 mg | ORAL_TABLET | Freq: Two times a day (BID) | ORAL | 1 refills | Status: DC
  Filled 2020-11-06: qty 28, 14d supply, fill #0
  Filled 2020-11-20: qty 60, 30d supply, fill #1

## 2020-11-06 MED ORDER — METFORMIN HCL 500 MG PO TABS
500.0000 mg | ORAL_TABLET | Freq: Two times a day (BID) | ORAL | 0 refills | Status: DC
  Filled 2020-11-06 – 2020-11-14 (×2): qty 60, 30d supply, fill #0

## 2020-11-06 MED ORDER — ROSUVASTATIN CALCIUM 5 MG PO TABS
5.0000 mg | ORAL_TABLET | Freq: Every day | ORAL | 0 refills | Status: DC
  Filled 2020-11-06: qty 14, 14d supply, fill #0
  Filled 2020-11-20: qty 16, 16d supply, fill #1
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
## 2020-11-06 NOTE — Patient Instructions (Signed)
Plan de alimentacin cardiosaludable Heart-Healthy Eating Plan La planificacin de las comidas cardiosaludables incluye lo siguiente:  Consumir menos grasas no saludables.  Consumir ms grasas saludables.  Realizar otros cambios en su dieta. Hable con el mdico o con un especialista en alimentacin (nutricionista) para crear un plan de alimentacin que sea adecuado para usted. En qu consiste el plan? El mdico puede recomendarle un plan de alimentacin que incluya lo siguiente:  Grasas totales: ______% o menos del total de caloras por da.  Grasas saturadas: ______% o menos del total de caloras por da.  Colesterol: menos de _________mg por da. Cules son algunos consejos para seguir este plan? Al cocinar Evite frer los alimentos. En cambio, trate de cocinarlos en el horno, en la plancha o en la parrilla, o hervirlos. Tambin puede reducir las grasas de la siguiente forma:  Quite la piel de las aves.  Quite todas las grasas visibles de las carnes.  Cocine al vapor las verduras en agua o caldo. Planificacin de las comidas  En las comidas, divida su plato en cuatro partes iguales: ? Llene la mitad del plato con verduras y ensaladas de hojas verdes. ? Llene un cuarto del plato con cereales integrales. ? Llene un cuarto del plato con alimentos con protenas magras.  Coma 4 o 5porciones de verduras por da. Una porcin de verduras equivale a lo siguiente: ? 1taza de verduras crudas o cocidas. ? 2 tazas de verduras de hojas verdes crudas.  Coma 4 o 5porciones de frutas por da. Una porcin de fruta equivale a lo siguiente: ? 1 fruta mediana entera. ? taza de fruta desecada. ?  taza de fruta fresca, congelada o enlatada. ? taza de jugo 100% de fruta.  Consuma ms alimentos que tengan fibra soluble. Ellos son las manzanas, el brcoli, las zanahorias, los frijoles, los guisantes y la cebada. Trate de consumir de 20a 30g de fibra por da.  Coma 4 o 5porciones  de frutos secos, legumbres y semillas por semana: ? 1 porcin de frijoles o legumbres secos equivale a taza despus de su coccin. ? 1 porcin de frutos secos equivale a  de taza. ? 1 porcin de semillas equivale a una cucharada.   Informacin general  Coma ms comidas caseras. Coma menos comida de restaurantes, bufs y comida rpida.  Limite o evite el alcohol.  Limite los alimentos con alto contenido de almidn y azcar.  Evite las comidas fritas.  Baje de peso si es necesario.  Intente llevar un registro de la cantidad de sal (sodio) que ingiere. Esto es importante si tiene presin arterial alta. Pdale a su mdico que le d ms informacin al respecto.  Trate de incorporar comidas vegetarianas cada semana. Grasas  Elija las grasas saludables. Estas incluyen aceite de oliva y de canola, semillas de lino, nueces, almendras y semillas.  Consuma ms grasas omega-3. Estas incluyen salmn, caballa, sardinas, atn, aceite de lino y semillas de lino molidas. Intente comer pescado al menos dos veces por semana.  Lea las etiquetas de los alimentos. Evite los alimentos que contienen grasas trans o altas cantidades de grasas saturadas.  Limite el consumo de grasas saturadas. ? Estas se encuentran frecuentemente en productos de origen animal, como carnes, mantequilla y crema. ? Tambin se encuentran en alimentos de origen vegetal, como el aceite de palma, el aceite de palmiste y el aceite de coco.  Evite los alimentos con aceites parcialmente hidrogenados. Estos tienen grasas trans. Entre los ejemplos, se incluyen margarinas en barra, algunas margarinas untables,   galletas dulces y saladas y otros productos horneados. Qu alimentos puedo comer? Frutas Frutas frescas, en conserva (en su jugo natural) o frutas congeladas. Verduras Verduras frescas o congeladas (crudas, al vapor, asadas o grilladas). Ensaladas de hojas verdes. Cereales La mayora de los cereales. Elija casi siempre trigo  integral y cereales integrales. Arroz y pastas, incluido el arroz integral y las pastas elaboradas con trigo integral. Carnes y otras protenas Carnes magras de res, ternera, cerdo y cordero a las que se les haya quitado la grasa visible. Pollo y pavo sin piel. Todos los pescados y mariscos. Pato salvaje, conejo, faisn y venado. Claras de huevo o sustitutos del huevo bajos en colesterol. Porotos, guisantes y lentejas secos y tofu. Semillas y la mayora de los frutos secos. Lcteos Quesos descremados y semidescremados, entre ellos, ricota y mozzarella. Leche descremada o al 1% que sea lquida, en polvo o evaporada. Suero de leche elaborado con leche semidescremada. Yogur descremado o semidescremado. Grasas y aceites Margarinas no hidrogenadas (sin grasas trans). Aceites vegetales, incluido el de soja, ssamo, girasol, oliva, man, crtamo, maz, canola y semillas de algodn. Alios para ensalada o mayonesa elaborados con aceite vegetal. Bebidas Agua mineral. T y caf. Gaseosas dietticas. Dulces y postres Sorbete, gelatina y helado de frutas. Pequeas cantidades de chocolate amargo. Limite todos los dulces y postres. Alios y condimentos Todos los alios y condimentos. Es posible que los productos que se enumeran ms arriba no constituyan una lista completa de los alimentos y las bebidas que puede tomar. Consulte a un nutricionista para conocer ms opciones. Qu alimentos debo evitar? Frutas Fruta enlatada en almbar espeso. Frutas con salsa de crema o mantequilla. Frutas cocidas en aceite. Limite el consumo de coco. Verduras Verduras cocinadas con salsas de queso, crema o mantequilla. Verduras fritas. Cereales Panes elaborados con grasas saturadas o trans, aceites o leche entera. Croissants. Panecillos dulces. Rosquillas. Galletas con alto contenido de grasas, como las que contienen queso. Carnes y otras protenas Carnes grasas, como perros calientes, costillas de res, salchichas, tocino,  asado de costillar o chuletn. Fiambres con alto contenido de grasas, como salame y mortadela. Caviar. Pato y ganso domsticos. Vsceras, como hgado. Lcteos Crema, crema agria, queso crema y queso cottage con crema. Quesos enteros. Leche entera o al 2% que sea lquida, evaporada o condensada. Suero de leche entero. Salsa de crema o queso con alto contenido de grasas. Yogur elaborado con leche entera. Grasas y aceites Grasa de carne o materia grasa. Manteca de cacao, aceites hidrogenados, aceite de palma, aceite de coco, aceite de palmiste. Grasas y materias grasas slidas, incluida la grasa del tocino, el cerdo salado, la manteca de cerdo y la mantequilla. Sustitutos no lcteos de la crema. Aderezos para ensalada con queso o crema agria. Bebidas Refrescos regulares y jugos con agregado de azcar. Dulces y postres Glaseados. Pudin. Galletas dulces. Bizcochuelos. Pasteles. Chocolate con leche o chocolate blanco. Almbares con mantequilla. Helados o bebidas elaboradas con helado con alto contenido de grasas. Esta no es una lista completa de los alimentos y las bebidas que se deben evitar. Consulte a un nutricionista para obtener ms informacin. Resumen  La planificacin de las comidas cardiosaludables incluye comer menos grasas poco saludables, comer ms grasas saludables y hacer otros cambios en su dieta.  Siga una dieta equilibrada. Esta incluye frutas y verduras, productos lcteos descremados o semidescremados, protenas magras, frutos secos y legumbres, cereales integrales y aceites y grasas cardiosaludables. Esta informacin no tiene como fin reemplazar el consejo del mdico. Asegrese de hacerle al   mdico cualquier pregunta que tenga. Document Revised: 09/24/2017 Document Reviewed: 09/24/2017 Elsevier Patient Education  2021 Elsevier Inc. https://www.nhlbi.nih.gov/files/docs/public/heart/dash_brief.pdf">  Plan de alimentacin DASH DASH Eating Plan DASH es la sigla en ingls de "Enfoques  Alimentarios para Detener la Hipertensin". El plan de alimentacin DASH ha demostrado:  Bajar la presin arterial elevada (hipertensin).  Reducir el riesgo de diabetes tipo 2, enfermedad cardaca y accidente cerebrovascular.  Ayudar a perder peso. Consejos para seguir este plan Leer las etiquetas de los alimentos  Verifique la cantidad de sal (sodio) por porcin en las etiquetas de los alimentos. Elija alimentos con menos del 5 por ciento del valor diario de sodio. Generalmente, los alimentos con menos de 300 miligramos (mg) de sodio por porcin se encuadran dentro de este plan alimentario.  Para encontrar cereales integrales, busque la palabra "integral" como primera palabra en la lista de ingredientes. Al ir de compras  Compre productos en los que en su etiqueta diga: "bajo contenido de sodio" o "sin agregado de sal".  Compre alimentos frescos. Evite los alimentos enlatados y comidas precocidas o congeladas. Al cocinar  Evite agregar sal cuando cocine. Use hierbas o aderezos sin sal, en lugar de sal de mesa o sal marina. Consulte al mdico o farmacutico antes de usar sustitutos de la sal.  No fra los alimentos. A la hora de cocinar los alimentos opte por hornearlos, hervirlos, grillarlos, asarlos al horno y asarlos a la parrilla.  Cocine con aceites cardiosaludables, como oliva, canola, aguacate, soja o girasol. Planificacin de las comidas  Consuma una dieta equilibrada, que incluya lo siguiente: ? 4o ms porciones de frutas y 4 o ms porciones de verduras por da. Trate de que medio plato de cada comida sea de frutas y verduras. ? De 6 a 8porciones de cereales integrales todos los das. ? Menos de 6 onzas (170g) de carne, aves o pescado magros por da. Una porcin de 3 onzas (85g) de carne tiene casi el mismo tamao que un mazo de cartas. Un huevo equivale a 1 onza (28g). ? De 2 a 3 porciones de productos lcteos descremados por da. Una porcin es 1taza (237ml). ? 1  porcin de frutos secos, semillas o frijoles 5 veces por semana. ? De 2 a 3 porciones de grasas cardiosaludables. Las grasas saludables llamadas cidos grasos omega-3 se encuentran en alimentos como las nueces, las semillas de lino, las leches fortificadas y los huevos. Estas grasas tambin se encuentran en los pescados de agua fra, como la sardina, el salmn y la caballa.  Limite la cantidad que consume de: ? Alimentos enlatados o envasados. ? Alimentos con alto contenido de grasa trans, como algunos alimentos fritos. ? Alimentos con alto contenido de grasa saturada, como carne con grasa. ? Postres y otros dulces, bebidas azucaradas y otros alimentos con azcar agregada. ? Productos lcteos enteros.  No le agregue sal a los alimentos antes de probarlos.  No coma ms de 4 yemas de huevo por semana.  Trate de comer al menos 2 comidas vegetarianas por semana.  Consuma ms comida casera y menos de restaurante, de bares y comida rpida.   Estilo de vida  Cuando coma en un restaurante, pida que preparen su comida con menos sal o, en lo posible, sin nada de sal.  Si bebe alcohol: ? Limite la cantidad que bebe:  De 0 a 1 medida por da para las mujeres que no estn embarazadas.  De 0 a 2 medidas por da para los hombres. ? Est atento a la cantidad   de alcohol que hay en las bebidas que toma. En los [place], una medida equivale a una botella de cerveza de 12oz (355ml), un vaso de vino de 5oz (148ml) o un vaso de una bebida alcohlica de alta graduacin de 1oz (44ml). Informacin general  Evite ingerir ms de 2300 mg de sal por da. Si tiene hipertensin, es posible que necesite reducir la ingesta de sodio a 1,500 mg por da.  Trabaje con su mdico para mantener un peso saludable o perder peso. Pregntele cul es el peso recomendado para usted.  Realice al menos 30 minutos de ejercicio que haga que se acelere su corazn (ejercicio aerbico) la mayora de los das de la semana.  Estas actividades pueden incluir caminar, nadar o andar en bicicleta.  Trabaje con su mdico o nutricionista para ajustar su plan alimentario a sus necesidades calricas personales. Qu alimentos debo comer? Frutas Todas las frutas frescas, congeladas o disecadas. Frutas enlatadas en jugo natural (sin agregado de azcar). Verduras Verduras frescas o congeladas (crudas, al vapor, asadas o grilladas). Jugos de tomate y verduras con bajo contenido de sodio o reducidos en sodio. Salsa y pasta de tomate con bajo contenido de sodio o reducidas en sodio. Verduras enlatadas con bajo contenido de sodio o reducidas en sodio. Granos Pan de salvado o integral. Pasta de salvado o integral. Arroz integral. Avena. Quinua. Trigo burgol. Cereales integrales y con bajo contenido de sodio. Pan pita. Galletitas de agua con bajo contenido de grasa y sodio. Tortillas de harina integral. Carnes y otras protenas Pollo o pavo sin piel. Carne de pollo o de pavo molida. Cerdo desgrasado. Pescado y mariscos. Claras de huevo. Porotos, guisantes o lentejas secos. Frutos secos, mantequilla de frutos secos y semillas sin sal. Frijoles enlatados sin sal. Cortes de carne vacuna magra, desgrasada. Carne precocida o curada magra y baja en sodio, como embutidos o panes de carne. Lcteos Leche descremada (1%) o descremada. Quesos reducidos en grasa, con bajo contenido de grasa o descremados. Queso blanco o ricota sin grasa, con bajo contenido de sodio. Yogur semidescremado o descremado. Queso con bajo contenido de grasa y sodio. Grasas y aceites Margarinas untables que no contengan grasas trans. Aceite vegetal. Mayonesa y aderezos para ensaladas livianos, reducidos en grasa o con bajo contenido de grasas (reducidos en sodio). Aceite de canola, crtamo, oliva, aguacate, soja y girasol. Aguacate. Alios y condimentos Hierbas. Especias. Mezclas de condimentos sin sal. Otros alimentos Palomitas de maz y pretzels sin sal. Dulces con  bajo contenido de grasas. Es posible que los productos que se enumeran ms arriba no constituyan una lista completa de los alimentos y las bebidas que puede tomar. Consulte a un nutricionista para obtener ms informacin. Qu alimentos debo evitar? Frutas Fruta enlatada en almbar liviano o espeso. Frutas cocidas en aceite. Frutas con salsa de crema o mantequilla. Verduras Verduras con crema o fritas. Verduras en salsa de queso. Verduras enlatadas regulares (que no sean con bajo contenido de sodio o reducidas en sodio). Pasta y salsa de tomates enlatadas regulares (que no sean con bajo contenido de sodio o reducidas en sodio). Jugos de tomate y verduras regulares (que no sean con bajo contenido de sodio o reducidos en sodio). Pepinillos. Aceitunas. Granos Productos de panificacin hechos con grasa, como medialunas, magdalenas y algunos panes. Comidas con arroz o pasta seca listas para usar. Carnes y otras protenas Cortes de carne con alto contenido de grasa. Costillas. Carne frita. Tocino. Mortadela, salame y otras carnes precocidas o curadas, como embutidos o panes   de carne. Grasa de la espalda del cerdo (panceta). Salchicha de cerdo. Frutos secos y semillas con sal. Frijoles enlatados con agregado de sal. Pescado enlatado o ahumado. Huevos enteros o yemas. Pollo o pavo con piel. Lcteos Leche entera o al 2%, crema y mitad leche y mitad crema. Queso crema entero o con toda su grasa. Yogur entero o endulzado. Quesos con toda su grasa. Sustitutos de cremas no lcteas. Coberturas batidas. Quesos para untar y quesos procesados. Grasas y aceites Mantequilla. Margarina en barra. Manteca de cerdo. Lardo. Mantequilla clarificada. Grasa de panceta. Aceites tropicales como aceite de coco, palmiste o palma. Alios y condimentos Sal de cebolla, sal de ajo, sal condimentada, sal de mesa y sal marina. Salsa [place]. Salsa trtara. Salsa barbacoa. Salsa teriyaki. Salsa de soja, incluso la que tiene  contenido reducido de sodio. Salsa de carne. Salsas en lata y envasadas. Salsa de pescado. Salsa de ostras. Salsa rosada. Rbanos picantes comprados en tiendas. [place]up. Mostaza. Saborizantes y tiernizantes para carne. Caldo en cubitos. Salsas picantes. Adobos preelaborados o envasados. Aderezos para tacos preelaborados o envasados. Salsas de pepinillos. Aderezos comunes para ensalada. Otros alimentos Palomitas de maz y pretzels con sal. Es posible que los productos que se enumeran ms arriba no constituyan una lista completa de los alimentos y las bebidas que debe evitar. Consulte a un nutricionista para obtener ms informacin. Dnde buscar ms informacin  National Heart, Lung, and Blood Institute (Instituto Nacional del Corazn, los Pulmones y la Sangre): www.nhlbi.nih.gov  American Heart Association (Asociacin Estadounidense del Corazn): www.heart.org  Academy of Nutrition and Dietetics (Academia de Nutricin y Diettica): www.eatright.org  National Kidney Foundation (Fundacin Nacional del Rin): www.kidney.org Resumen  El plan de alimentacin DASH ha demostrado bajar la presin arterial elevada (hipertensin). Tambin puede reducir el riesgo de diabetes tipo 2, enfermedad cardaca y accidente cerebrovascular.  Cuando siga el plan de alimentacin DASH, trate de comer ms frutas frescas y verduras, cereales integrales, carnes magras, lcteos descremados y grasas cardiosaludables.  Con el plan de alimentacin DASH, deber limitar el consumo de sal (sodio) a 2,300 mg por da. Si tiene hipertensin, es posible que necesite reducir la ingesta de sodio a 1,500 mg por da.  Trabaje con su mdico o nutricionista para ajustar su plan alimentario a sus necesidades calricas personales. Esta informacin no tiene como fin reemplazar el consejo del mdico. Asegrese de hacerle al mdico cualquier pregunta que tenga. Document Revised: 07/19/2019 Document Reviewed: 07/19/2019 Elsevier Patient  Education  2021 Elsevier Inc. https://www.diabeteseducator.org/docs/default-source/living-with-diabetes/conquering-the-grocery-store-v1.pdf?sfvrsn=4">  Recuento de carbohidratos para la diabetes mellitus en los adultos Carbohydrate Counting for Diabetes Mellitus, Adult El recuento de carbohidratos es un mtodo para llevar un registro de la cantidad de carbohidratos que se ingieren. La ingesta natural de carbohidratos aumenta la cantidad de azcar (glucosa) en la sangre. El recuento de la cantidad de carbohidratos que se ingieren mejora el control del nivel de glucemia, lo que ayuda a manejar la diabetes. Es importante saber la cantidad de carbohidratos que se pueden ingerir en cada comida sin correr ningn riesgo. Esto es diferente en cada persona. Un nutricionista puede ayudarlo a crear un plan de alimentacin y a calcular la cantidad de carbohidratos que debe ingerir en cada comida y colacin. Qu alimentos contienen carbohidratos? Los siguientes alimentos incluyen carbohidratos:  Granos, como panes y cereales.  Frijoles secos y productos con soja.  Verduras con almidn, como papas, guisantes y maz.  Frutas y jugos de frutas.  Leche y yogur.  Dulces y colaciones, como pasteles, galletas, caramelos, papas fritas   de bolsa y refrescos.   Cmo se calculan los carbohidratos de los alimentos? Hay dos maneras de calcular los carbohidratos de los alimentos. Puede leer las etiquetas de los alimentos o aprender cules son los tamaos de las porciones estndar de los alimentos. Puede usar cualquiera de los dos mtodos o una combinacin de ambos. Usar la etiqueta de informacin nutricional La lista de informacin nutricional est incluida en las etiquetas de casi todas las bebidas y los alimentos envasados de los [place]. Incluye lo siguiente:  El tamao de la porcin.  Informacin sobre los nutrientes de cada porcin, incluidos los gramos (g) de carbohidratos por porcin. Para usar la  informacin nutricional:  Decida cuntas porciones va a comer.  Multiplique la cantidad de porciones por el nmero de carbohidratos por porcin.  El resultado es la cantidad total de carbohidratos que comer. Conocer los tamaos de las porciones estndar de los alimentos Cuando coma alimentos que contengan carbohidratos y que no estn envasados o no incluyan la informacin nutricional en la etiqueta, debe medir las porciones para poder calcular la cantidad de carbohidratos.  Mida los alimentos que comer con una balanza de alimentos o una taza medidora, si es necesario.  Decida cuntas porciones de tamao estndar comer.  Multiplique el nmero de porciones por15. En los alimentos que contienen carbohidratos, una porcin equivale a 15 g de carbohidratos. ? Por ejemplo, si come 2 tazas o 10onzas (300g) de fresas, habr comido 2porciones y 30g de carbohidratos (2porciones x 15g=30g).  En el caso de las comidas que contienen mezclas de ms de un alimento, como las sopas y los guisos, debe calcular los carbohidratos de cada alimento que se incluye. La siguiente lista contiene los tamaos de porciones estndar de los alimentos ricos en carbohidratos ms comunes. Cada una de estas porciones tiene aproximadamente 15g de carbohidratos:  1rebanada de pan.  1tortilla de seis pulgadas (15cm).  ? de taza o 2onzas (53g) de arroz o pasta cocidos.   taza o 3 onzas (85 g) de lentejas o frijoles cocidos o enlatados, escurridos y enjuagados.   taza o 3onzas (85g) de verduras con almidn, como guisantes, maz o zapallo.   taza o 4 onzas (120 g) de cereal caliente.   taza o 3 onzas (85 g) de papas hervidas o en pur, o  o 3 onzas (85 g) de una papa grande al horno.   taza o 4 onzas fluidas (118ml) de jugo de frutas.  1 taza u 8 onzas fluidas (237 ml) de leche.  1 unidad pequea o 4 onzas (106 g) de manzana.   unidad o 2 onzas (63 g) de una banana mediana.  1 taza o 5  oz (150 g) de fresas.  3 tazas o 1 oz (24g) de palomitas de maz. Cul sera un ejemplo de recuento de carbohidratos? Para calcular el nmero de carbohidratos de este ejemplo de comida, siga los pasos que se describen a continuacin. Ejemplo de comida  3 onzas (85g) de pechugas de pollo.  ? de taza o 4 onzas (106 g) de arroz integral.   taza o 3 onzas (85 g) de maz.  1 taza u 8 onzas fluidas (237 ml) de leche.  1 taza o 5onzas (150g) de fresas con crema batida sin azcar. Clculo de carbohidratos 1. Identifique los alimentos que contienen carbohidratos: ? Arroz. ? Maz. ? Leche. ? Fresas. 2. Calcule cuntas porciones come de cada alimento: ? 2 porciones de arroz. ? 1 porcin de maz. ? 1 porcin de   leche. ? 1 porcin de fresas. 3. Multiplique cada nmero de porciones por 15g: ? 2 porciones de arroz x 15 g = 30 g. ? 1 porcin de maz x 15 g = 15 g. ? 1 porcin de leche x 15 g = 15 g. ? 1 porcin de fresas x 15 g = 15 g. 4. Sume todas las cantidades para conocer el total de gramos de carbohidratos consumidos: ? 30g + 15g + 15g + 15g = 75g de carbohidratos en total. Consejos para seguir este plan Al ir de compras  Elabore un plan de comidas y luego haga una lista de compras.  Compre verduras frescas y congeladas, frutas frescas y congeladas, productos lcteos, huevos, frijoles, lentejas y cereales integrales.  Fjese en las etiquetas de los alimentos. Elija los alimentos que tengan ms fibra y menos azcar.  Evite los alimentos procesados y los alimentos con azcares agregados. Planificacin de las comidas  Trate de consumir la misma cantidad de carbohidratos en cada comida y en cada colacin.  Planifique tomar comidas y colaciones regulares y equilibradas. Dnde buscar ms informacin  American Diabetes Association (Asociacin Estadounidense de la Diabetes): www.diabetes.org  Centers for Disease Control and Prevention (Centros para el Control y la  Prevencin de Enfermedades): www.cdc.gov Resumen  El recuento de carbohidratos es un mtodo para llevar un registro de la cantidad de carbohidratos que se ingieren.  La ingesta natural de carbohidratos aumenta la cantidad de azcar (glucosa) en la sangre.  El recuento de la cantidad de carbohidratos que se ingieren mejora el control del nivel de glucemia, lo que ayuda a manejar la diabetes.  Un nutricionista puede ayudarlo a crear un plan de alimentacin y a calcular la cantidad de carbohidratos que debe ingerir en cada comida y colacin. Esta informacin no tiene como fin reemplazar el consejo del mdico. Asegrese de hacerle al mdico cualquier pregunta que tenga. Document Revised: 07/23/2019 Document Reviewed: 07/23/2019 Elsevier Patient Education  2021 Elsevier Inc.  

## 2020-11-14 ENCOUNTER — Other Ambulatory Visit: Payer: Self-pay

## 2020-11-20 ENCOUNTER — Other Ambulatory Visit: Payer: Self-pay | Admitting: Gerontology

## 2020-11-20 ENCOUNTER — Other Ambulatory Visit: Payer: Self-pay

## 2020-11-20 MED ORDER — OMEPRAZOLE 20 MG PO CPDR
20.0000 mg | DELAYED_RELEASE_CAPSULE | Freq: Every day | ORAL | 0 refills | Status: DC
  Filled 2020-11-20: qty 30, 30d supply, fill #0

## 2020-11-21 ENCOUNTER — Other Ambulatory Visit: Payer: Self-pay

## 2020-12-10 ENCOUNTER — Ambulatory Visit: Payer: Medicaid Other | Admitting: Gerontology

## 2020-12-10 ENCOUNTER — Other Ambulatory Visit: Payer: Self-pay

## 2020-12-10 ENCOUNTER — Encounter: Payer: Self-pay | Admitting: Gerontology

## 2020-12-10 VITALS — BP 137/80 | HR 60 | Temp 97.4°F | Ht 63.0 in | Wt 179.6 lb

## 2020-12-10 DIAGNOSIS — E7849 Other hyperlipidemia: Secondary | ICD-10-CM

## 2020-12-10 DIAGNOSIS — E119 Type 2 diabetes mellitus without complications: Secondary | ICD-10-CM

## 2020-12-10 DIAGNOSIS — I10 Essential (primary) hypertension: Secondary | ICD-10-CM

## 2020-12-10 DIAGNOSIS — E559 Vitamin D deficiency, unspecified: Secondary | ICD-10-CM

## 2020-12-10 LAB — GLUCOSE, POCT (MANUAL RESULT ENTRY): POC Glucose: 98 mg/dl (ref 70–99)

## 2020-12-10 MED ORDER — PROPRANOLOL HCL 20 MG PO TABS
20.0000 mg | ORAL_TABLET | Freq: Two times a day (BID) | ORAL | 2 refills | Status: DC
  Filled 2020-12-10 – 2020-12-23 (×2): qty 60, 30d supply, fill #0
  Filled 2021-01-29: qty 60, 30d supply, fill #1
  Filled 2021-04-09: qty 60, 30d supply, fill #2

## 2020-12-10 MED ORDER — METFORMIN HCL 500 MG PO TABS
500.0000 mg | ORAL_TABLET | Freq: Two times a day (BID) | ORAL | 2 refills | Status: DC
  Filled 2020-12-10 – 2020-12-23 (×2): qty 60, 30d supply, fill #0
  Filled 2021-01-29: qty 60, 30d supply, fill #1
  Filled 2021-04-09: qty 60, 30d supply, fill #2

## 2020-12-10 MED ORDER — ROSUVASTATIN CALCIUM 5 MG PO TABS
5.0000 mg | ORAL_TABLET | Freq: Every day | ORAL | 2 refills | Status: DC
  Filled 2020-12-10: qty 30, 30d supply, fill #0
  Filled 2020-12-10: qty 14, 14d supply, fill #0
  Filled 2020-12-23: qty 30, 30d supply, fill #0
  Filled 2021-01-29: qty 30, 30d supply, fill #1
  Filled 2021-04-09: qty 30, 30d supply, fill #2
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
## 2020-12-10 NOTE — Patient Instructions (Signed)
https://www.diabeteseducator.org/docs/default-source/living-with-diabetes/conquering-the-grocery-store-v1.pdf?[email or web address]">  Recuento de carbohidratos para la diabetes mellitus en los adultos Carbohydrate Counting for Diabetes Mellitus, Adult El recuento de carbohidratos es un mtodo para llevar un registro de la cantidad de carbohidratos que se ingieren. La ingesta natural de carbohidratos aumenta la cantidad de azcar (glucosa) en la sangre. El recuento de la cantidad de carbohidratos que se ingierenmejora el control del nivel de [name], lo que ayuda a Company secretary la diabetes. Es importante saber la cantidad de carbohidratos que se pueden ingerir en cada comida sin correr Surveyor, minerals. Esto es Government social research officer. Un nutricionista puede ayudarlo a crear un plan de alimentacin y a calcular lacantidad de carbohidratos que debe ingerir en cada comida y colacin. Qu alimentos contienen carbohidratos? Los siguientes alimentos incluyen carbohidratos: Granos, como panes y cereales. Frijoles secos y productos con soja. Verduras con almidn, como papas, guisantes y maz. [name] y jugos de frutas. Leche y Dentist. Dulces y colaciones, como pasteles, galletas, caramelos, papas fritas de bolsa y refrescos. Cmo se calculan los carbohidratos de los alimentos? Hay dos maneras de calcular los carbohidratos de los alimentos. Puede leer las etiquetas de los alimentos o aprender cules son los tamaos de las porciones estndar de los alimentos. Puede usar cualquiera de [name] o [name]. Usar la Air cabin crew de informacin nutricional La lista de informacin nutricional est incluida en las etiquetas de casi todas las bebidas y los alimentos envasados de los [name]. Incluye lo siguiente: El tamao de la porcin. Informacin sobre los nutrientes de cada porcin, incluidos los gramos (g) de carbohidratos por porcin. Para usar la informacin nutricional: Decida cuntas porciones va  a comer. Multiplique la cantidad de porciones por el nmero de carbohidratos por porcin. El resultado es la cantidad total de carbohidratos que comer. Conocer los tamaos de las porciones estndar de los alimentos Cuando coma alimentos que contengan carbohidratos y que no estn envasados o no incluyan la informacin nutricional en la etiqueta, debe medir las porciones para poder calcular la cantidad de carbohidratos. Mida los alimentos que comer con una balanza de alimentos o una taza medidora, si es necesario. Decida cuntas porciones de [name], systems. Multiplique el nmero de porciones por 15. En los alimentos que contienen carbohidratos, una porcin [name] a 15 g de carbohidratos. Por ejemplo, si come 2 tazas o 10 onzas (300 g) de fresas, habr comido 2 porciones y 30 g de carbohidratos (2 porciones x 15 g = 30 g). En el caso de las comidas que contienen mezclas de ms de un alimento, como las sopas y los guisos, debe calcular los carbohidratos de cada alimento que se incluye. La siguiente lista contiene los tamaos de porciones estndar de los alimentos ricos en carbohidratos ms comunes. Cada una de estas porciones tiene aproximadamente 15 g de carbohidratos: 1 rebanada de pan. 1 tortilla de seis pulgadas (15 cm). ? de taza o 2 onzas (53 g) de arroz o pasta cocidos.  taza o 3 onzas (85 g) de lentejas o frijoles cocidos o enlatados, escurridos y enjuagados.  taza o 3 onzas (85 g) de verduras con almidn, como guisantes, maz o zapallo.  taza o 4 onzas (120 g) de cereal caliente.  taza o 3 onzas (85 g) de papas hervidas o en pur, o  o 3 onzas (85 g) de una papa grande al horno.  taza o 4 onzas fluidas (118 ml) de jugo de frutas. 1 taza u 8 onzas fluidas (237 ml) de leche. 1 unidad pequea o 4  onzas (106 g) de manzana.  unidad o 2 onzas (63 g) de una banana mediana. 1 taza o 5 oz (150 g) de fresas. 3 tazas o 1 oz (24 g) de palomitas de maz. Cul sera un ejemplo de  recuento de carbohidratos? Para calcular el nmero de carbohidratos de este ejemplo de comida, siga lospasos que se describen a continuacin. Ejemplo de comida 3 onzas (85 g) de pechugas de pollo. ? de taza o 4 onzas (106 g) de arroz integral.  taza o 3 onzas (85 g) de maz. 1 taza u 8 onzas fluidas (237 ml) de leche. 1 taza o 5 onzas (150 g) de fresas con crema batida sin azcar. Clculo de carbohidratos Identifique los alimentos que contienen carbohidratos: Arroz. Maz. Leche. Anne Floyd. Calcule cuntas porciones come de cada alimento: 2 porciones de arroz. 1 porcin de maz. 1 porcin de leche. 1 porcin de fresas. Multiplique cada nmero de porciones por 15 g: 2 porciones de arroz x 15 g = 30 g. 1 porcin de maz x 15 g = 15 g. 1 porcin de leche x 15 g = 15 g. 1 porcin de fresas x 15 g = 15 g. Sume todas las cantidades para conocer el total de gramos de carbohidratos consumidos: 30 g + 15 g + 15 g + 15 g = 75 g de carbohidratos en total. Consejos para seguir este plan Al ir de compras [place] un plan de comidas y luego haga una lista de compras. Compre verduras frescas y congeladas, frutas frescas y congeladas, productos lcteos, huevos, frijoles, lentejas y cereales integrales. Fjese en las etiquetas de los alimentos. Elija los alimentos que tengan ms fibra y Neurosurgeon. Evite los alimentos procesados y los alimentos con [name], clinical. Planificacin de las comidas Trate de consumir la misma cantidad de carbohidratos en cada comida y en cada colacin. Planifique tomar comidas y colaciones regulares y equilibradas. Dnde buscar ms informacin American Diabetes Association (Asociacin Estadounidense de la Diabetes): www.diabetes.org Centers for Disease Control and Prevention (Centros para el Control y la Prevencin de Event organiser): FootballExhibition.com.br Resumen El recuento de carbohidratos es un mtodo para llevar un registro de la cantidad de carbohidratos que se  ingieren. La ingesta natural de carbohidratos aumenta la cantidad de azcar (glucosa) en la sangre. El recuento de la cantidad de carbohidratos que se ingieren mejora el control del nivel de [name], lo que ayuda a Company secretary la diabetes. Un nutricionista puede ayudarlo a crear un plan de alimentacin y a calcular la cantidad de carbohidratos que debe ingerir en cada comida y colacin. Esta informacin no tiene Theme park manager el consejo del mdico. Asegresede hacerle al mdico cualquier pregunta que tenga. Document Revised: 07/23/2019 Document Reviewed: 07/23/2019 Elsevier Patient Education  2021 [employer].

## 2020-12-11 ENCOUNTER — Other Ambulatory Visit: Payer: Self-pay

## 2020-12-22 ENCOUNTER — Other Ambulatory Visit: Payer: Self-pay

## 2020-12-23 ENCOUNTER — Other Ambulatory Visit: Payer: Self-pay

## 2020-12-23 ENCOUNTER — Other Ambulatory Visit: Payer: Self-pay | Admitting: Gerontology

## 2020-12-23 ENCOUNTER — Ambulatory Visit: Payer: Medicaid Other | Admitting: Pharmacy Technician

## 2020-12-23 DIAGNOSIS — Z79899 Other long term (current) drug therapy: Secondary | ICD-10-CM

## 2020-12-23 MED FILL — Omeprazole Cap Delayed Release 20 MG: ORAL | 30 days supply | Qty: 30 | Fill #0 | Status: AC

## 2020-12-23 NOTE — Progress Notes (Signed)
Patient speaks Spanish.  Interpretation provided by [facility], ID# [id number]. Completed [facility] application and contract for re-certification.  Patient agreed to all terms of the [facility] contract.    Patient approved to receive medication assistance at [facility] until time her Medicare becomes effective in 08/26/21.  Provide patient with information about how to apply for Medicare Savings and [place]    Provided patient with community resource material based on her particular needs.    Sherilyn Dacosta Care Manager [facility]

## 2020-12-25 ENCOUNTER — Other Ambulatory Visit: Payer: Self-pay

## 2020-12-31 ENCOUNTER — Other Ambulatory Visit: Payer: Self-pay

## 2021-01-15 ENCOUNTER — Other Ambulatory Visit: Payer: Self-pay

## 2021-01-22 ENCOUNTER — Other Ambulatory Visit: Payer: Medicaid Other

## 2021-01-22 ENCOUNTER — Other Ambulatory Visit: Payer: Self-pay

## 2021-01-22 DIAGNOSIS — E119 Type 2 diabetes mellitus without complications: Secondary | ICD-10-CM

## 2021-01-22 DIAGNOSIS — E559 Vitamin D deficiency, unspecified: Secondary | ICD-10-CM

## 2021-01-23 LAB — MICROALBUMIN / CREATININE URINE RATIO
Creatinine, Urine: 27 mg/dL
Microalb/Creat Ratio: <11 mg/g creat (ref 0–29)
Microalbumin, Urine: <3 ug/mL

## 2021-01-23 LAB — HEMOGLOBIN A1C
Est. average glucose Bld gHb Est-mCnc: 131 mg/dL
Hgb A1c MFr Bld: 6.2 % — ABNORMAL HIGH (ref 4.8–5.6)

## 2021-01-23 LAB — VITAMIN D 25 HYDROXY (VIT D DEFICIENCY, FRACTURES): Vit D, 25-Hydroxy: 31.2 ng/mL (ref 30.0–100.0)

## 2021-01-29 ENCOUNTER — Other Ambulatory Visit: Payer: Self-pay

## 2021-01-29 ENCOUNTER — Ambulatory Visit: Payer: Medicaid Other | Admitting: Gerontology

## 2021-01-29 ENCOUNTER — Other Ambulatory Visit: Payer: Self-pay | Admitting: Gerontology

## 2021-01-29 VITALS — BP 129/73 | Ht 62.0 in | Wt 177.7 lb

## 2021-01-29 DIAGNOSIS — E119 Type 2 diabetes mellitus without complications: Secondary | ICD-10-CM

## 2021-01-29 DIAGNOSIS — I10 Essential (primary) hypertension: Secondary | ICD-10-CM

## 2021-01-29 DIAGNOSIS — Z0000 Encounter for general adult medical examination without abnormal findings: Secondary | ICD-10-CM | POA: Insufficient documentation

## 2021-01-29 MED FILL — Omeprazole Cap Delayed Release 20 MG: ORAL | 30 days supply | Qty: 30 | Fill #0 | Status: CN

## 2021-01-29 NOTE — Patient Instructions (Signed)
https://www.nhlbi.nih.gov/files/docs/public/heart/dash_brief.pdf">  Plan de alimentacin DASH DASH Eating Plan DASH es la sigla en ingls de "Enfoques Alimentarios para Detener la Hipertensin". El plan de alimentacin DASH ha demostrado: Bajar la presin arterial elevada (hipertensin). Reducir el riesgo de diabetes tipo 2, enfermedad cardaca y accidente cerebrovascular. Ayudar a perder peso. Consejos para seguir este plan Leer las etiquetas de los alimentos Verifique la cantidad de sal (sodio) por porcin en las etiquetas de los alimentos. Elija alimentos con menos del 5 por ciento del valor diario de sodio. Generalmente, los alimentos con menos de 300 miligramos (mg) de sodio por porcin se encuadran dentro de este plan alimentario. Para encontrar cereales integrales, busque la palabra "integral" como primera palabra en la lista de ingredientes. Al ir de compras Compre productos en los que en su etiqueta diga: "bajo contenido de sodio" o "sin agregado de sal". Compre alimentos frescos. Evite los alimentos enlatados y comidas precocidas o congeladas. Al cocinar Evite agregar sal cuando cocine. Use hierbas o aderezos sin sal, en lugar de sal de mesa o sal marina. Consulte al mdico o farmacutico antes de usar sustitutos de la sal. No fra los alimentos. A la hora de cocinar los alimentos opte por hornearlos, hervirlos, grillarlos, asarlos al horno y asarlos a la parrilla. Cocine con aceites cardiosaludables, como oliva, canola, aguacate, soja o girasol. Planificacin de las comidas  Consuma una dieta equilibrada, que incluya lo siguiente: 4 o ms porciones de frutas y 4 o ms porciones de verduras por da. Trate de que medio plato de cada comida sea de frutas y verduras. De 6 a 8 porciones de cereales integrales todos los das. Menos de 6 onzas (170 g) de carne, aves o pescado magros por da. Una porcin de 3 onzas (85 g) de carne tiene casi el mismo tamao que un mazo de cartas. Un huevo  equivale a 1 onza (28 g). De 2 a 3 porciones de productos lcteos descremados por da. Una porcin es 1 taza (237 ml). 1 porcin de frutos secos, semillas o frijoles 5 veces por semana. De 2 a 3 porciones de grasas cardiosaludables. Las grasas saludables llamadas cidos grasos omega-3 se encuentran en alimentos como las nueces, las semillas de lino, las leches fortificadas y los huevos. Estas grasas tambin se encuentran en los pescados de agua fra, como la sardina, el salmn y la caballa. Limite la cantidad que consume de: Alimentos enlatados o envasados. Alimentos con alto contenido de grasa trans, como algunos alimentos fritos. Alimentos con alto contenido de grasa saturada, como carne con grasa. Postres y otros dulces, bebidas azucaradas y otros alimentos con azcar agregada. Productos lcteos enteros. No le agregue sal a los alimentos antes de probarlos. No coma ms de 4 yemas de huevo por semana. Trate de comer al menos 2 comidas vegetarianas por semana. Consuma ms comida casera y menos de restaurante, de bares y comida rpida.  Estilo de vida Cuando coma en un restaurante, pida que preparen su comida con menos sal o, en lo posible, sin nada de sal. Si bebe alcohol: Limite la cantidad que bebe: De 0 a 1 medida por da para las mujeres que no estn embarazadas. De 0 a 2 medidas por da para los hombres. Est atento a la cantidad de alcohol que hay en las bebidas que toma. En los [place], una medida equivale a una botella de cerveza de 12 oz (355 ml), un vaso de vino de 5 oz (148 ml) o un vaso de una bebida alcohlica de alta graduacin   de 1 oz (44 ml). Informacin general Evite ingerir ms de 2300 mg de sal por da. Si tiene hipertensin, es posible que necesite reducir la ingesta de sodio a 1,500 mg por da. Trabaje con su mdico para mantener un peso saludable o perder peso. Pregntele cul es el peso recomendado para usted. Realice al menos 30 minutos de ejercicio que haga  que se acelere su corazn (ejercicio aerbico) la mayora de los das de la semana. Estas actividades pueden incluir caminar, nadar o andar en bicicleta. Trabaje con su mdico o nutricionista para ajustar su plan alimentario a sus necesidades calricas personales. Qu alimentos debo comer? Frutas Todas las frutas frescas, congeladas o disecadas. Frutas enlatadas en jugonatural (sin agregado de azcar). Verduras Verduras frescas o congeladas (crudas, al vapor, asadas o grilladas). Jugos de tomate y verduras con bajo contenido de sodio o reducidos en sodio. Salsa y pasta de tomate con bajo contenido de sodio o reducidas en sodio. Verdurasenlatadas con bajo contenido de sodio o reducidas en sodio. Granos Pan de salvado o integral. Pasta de salvado o integral. Arroz integral. Avena. Quinua. Trigo burgol. Cereales integrales y con bajo contenido de sodio. Pan pita. Galletitas de agua con bajo contenido de grasa y sodio. Tortillas deharina integral. Carnes y otras protenas Pollo o pavo sin piel. Carne de pollo o de pavo molida. Cerdo desgrasado. Pescado y mariscos. Claras de huevo. Porotos, guisantes o lentejas secos. Frutos secos, mantequilla de frutos secos y semillas sin sal. Frijoles enlatados sin sal. Cortes de carne vacuna magra, desgrasada. Carne precocida ocurada magra y baja en sodio, como embutidos o panes de carne. Lcteos Leche descremada (1 %) o descremada. Quesos reducidos en grasa, con bajo contenido de grasa o descremados. Queso blanco o ricota sin grasa, con bajo contenido de sodio. Yogur semidescremado o descremado. Queso con bajo contenidode grasa y sodio. Grasas y aceites Margarinas untables que no contengan grasas trans. Aceite vegetal. Mayonesa y aderezos para ensaladas livianos, reducidos en grasa o con bajo contenido de grasas (reducidos en sodio). Aceite de canola,crtamo, oliva, aguacate, soja y girasol. Aguacate. Alios y condimentos Hierbas. Especias. Mezclas de condimentos  sin sal. Otros alimentos Palomitas de maz y pretzels sin sal. Dulces con bajo contenido de grasas. Es posible que los productos que se enumeran ms arriba no constituyan una lista completa de los alimentos y las bebidas que puede tomar. Consulte a un nutricionista para obtener ms informacin. Qu alimentos debo evitar? Frutas Fruta enlatada en almbar liviano o espeso. Frutas cocidas en aceite. Frutascon salsa de crema o mantequilla. Verduras Verduras con crema o fritas. Verduras en salsa de queso. Verduras enlatadas regulares (que no sean con bajo contenido de sodio o reducidas en sodio). Pasta y salsa de tomates enlatadas regulares (que no sean con bajo contenido de sodio o reducidas en sodio). Jugos de tomate y verduras regulares (que no sean conbajo contenido de sodio o reducidos en sodio). Pepinillos. Aceitunas. Granos Productos de panificacin hechos con grasa, como medialunas, magdalenas yalgunos panes. Comidas con arroz o pasta seca listas para usar. Carnes y otras protenas Cortes de carne con alto contenido de grasa. Costillas. Carne frita. Tocino. Mortadela, salame y otras carnes precocidas o curadas, como embutidos o panes de carne. Grasa de la espalda del cerdo (panceta). Salchicha de cerdo. Frutos secos y semillas con sal. Frijoles enlatados con agregado de sal. Pescado enlatado o ahumado. Huevos enteros o yemas. Pollo opavo con piel. Lcteos Leche entera o al 2 %, crema y mitad leche y mitad crema. Queso   crema entero o con toda su grasa. Yogur entero o endulzado. Quesos con toda su grasa. Sustitutos de cremas no lcteas. Coberturas batidas. Quesos para untar y quesosprocesados. Grasas y aceites Mantequilla. Margarina en barra. Manteca de cerdo. Lardo. Mantequilla clarificada. Grasa de panceta. Aceites tropicales como aceite de coco, palmisteo palma. Alios y condimentos Sal de cebolla, sal de ajo, sal condimentada, sal de mesa y sal marina. Salsa [place]. Salsa trtara.  Salsa barbacoa. Salsa teriyaki. Salsa de soja, incluso la que tiene contenido reducido de sodio. Salsa de carne. Salsas en lata y envasadas. Salsa de pescado. Salsa de ostras. Salsa rosada. Rbanos picantes comprados en tiendas. [place]up. Mostaza. Saborizantes y tiernizantes para carne. Caldo en cubitos. Salsas picantes. Adobos preelaborados o envasados. Aderezos para tacos preelaborados o envasados. Salsas de pepinillos.Aderezos comunes para ensalada. Otros alimentos Palomitas de maz y pretzels con sal. Es posible que los productos que se enumeran ms arriba no constituyan una lista completa de los alimentos y las bebidas que debe evitar. Consulte a un nutricionista para obtener ms informacin. Dnde buscar ms informacin National Heart, Lung, and Blood Institute (Instituto Nacional del Corazn, los Pulmones y la Sangre): www.nhlbi.nih.gov American Heart Association (Asociacin Estadounidense del Corazn): www.heart.org Academy of Nutrition and Dietetics (Academia de Nutricin y Diettica): www.eatright.org National Kidney Foundation (Fundacin Nacional del Rin): www.kidney.org Resumen El plan de alimentacin DASH ha demostrado bajar la presin arterial elevada (hipertensin). Tambin puede reducir el riesgo de diabetes tipo 2, enfermedad cardaca y accidente cerebrovascular. Cuando siga el plan de alimentacin DASH, trate de comer ms frutas frescas y verduras, cereales integrales, carnes magras, lcteos descremados y grasas cardiosaludables. Con el plan de alimentacin DASH, deber limitar el consumo de sal (sodio) a 2,300 mg por da. Si tiene hipertensin, es posible que necesite reducir la ingesta de sodio a 1,500 mg por da. Trabaje con su mdico o nutricionista para ajustar su plan alimentario a sus necesidades calricas personales. Esta informacin no tiene como fin reemplazar el consejo del mdico. Asegresede hacerle al mdico cualquier pregunta que tenga. Document Revised: 07/19/2019  Document Reviewed: 07/19/2019 Elsevier Patient Education  2022 Elsevier Inc.  
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
## 2021-01-30 ENCOUNTER — Other Ambulatory Visit: Payer: Self-pay

## 2021-01-30 ENCOUNTER — Telehealth: Payer: Self-pay | Admitting: Pharmacist

## 2021-01-30 MED FILL — Omeprazole Cap Delayed Release 20 MG: ORAL | 30 days supply | Qty: 30 | Fill #0 | Status: AC

## 2021-01-30 NOTE — Telephone Encounter (Signed)
Patient approved for Medication assistance at [facility] until 08/26/21. Will have Medicare beginning 08/26/21  Tar[facility] Administrative Assistant

## 2021-02-09 ENCOUNTER — Other Ambulatory Visit: Payer: Self-pay

## 2021-02-17 NOTE — Progress Notes (Signed)
Patient pre-screened for [facility] eligibility due to COVID 19 precautions. Two patient identifiers used for verification that I was speaking to correct patient.  Patient to Present directly to [facility] 02/18/21 for [facility] screening mammogram.  

## 2021-02-18 ENCOUNTER — Other Ambulatory Visit: Payer: Self-pay

## 2021-02-18 ENCOUNTER — Ambulatory Visit: Payer: Medicaid Other | Attending: Oncology

## 2021-02-18 ENCOUNTER — Ambulatory Visit
Admission: RE | Admit: 2021-02-18 | Discharge: 2021-02-18 | Disposition: A | Discharge status: Home / Self Care | Payer: Medicaid Other | Source: Ambulatory Visit | Attending: Oncology | Admitting: Oncology

## 2021-02-18 DIAGNOSIS — Z0000 Encounter for general adult medical examination without abnormal findings: Secondary | ICD-10-CM

## 2021-02-20 ENCOUNTER — Other Ambulatory Visit: Payer: Self-pay | Admitting: *Deleted

## 2021-02-20 ENCOUNTER — Inpatient Hospital Stay
Admission: RE | Admit: 2021-02-20 | Discharge: 2021-02-20 | Disposition: A | Discharge status: Home / Self Care | Payer: Self-pay | Source: Ambulatory Visit | Attending: *Deleted | Admitting: *Deleted

## 2021-02-20 DIAGNOSIS — Z1231 Encounter for screening mammogram for malignant neoplasm of breast: Secondary | ICD-10-CM

## 2021-02-23 NOTE — Progress Notes (Signed)
Letter mailed from [facility] to notify of normal mammogram results.  Patient to return in one year for annual screening.  Copy to [facility]. 

## 2021-04-09 ENCOUNTER — Other Ambulatory Visit: Payer: Self-pay | Admitting: Gerontology

## 2021-04-09 ENCOUNTER — Other Ambulatory Visit: Payer: Self-pay

## 2021-04-09 MED FILL — Omeprazole Cap Delayed Release 20 MG: ORAL | 30 days supply | Qty: 30 | Fill #0 | Status: AC

## 2021-06-03 ENCOUNTER — Encounter: Payer: Self-pay | Admitting: Gerontology

## 2021-06-03 ENCOUNTER — Other Ambulatory Visit: Payer: Self-pay

## 2021-06-03 ENCOUNTER — Ambulatory Visit: Payer: Medicaid Other | Admitting: Gerontology

## 2021-06-03 VITALS — BP 137/69 | HR 74 | Temp 98.3°F | Resp 16 | Ht 64.0 in | Wt 186.1 lb

## 2021-06-03 DIAGNOSIS — I10 Essential (primary) hypertension: Secondary | ICD-10-CM

## 2021-06-03 DIAGNOSIS — R7303 Prediabetes: Secondary | ICD-10-CM

## 2021-06-03 DIAGNOSIS — E119 Type 2 diabetes mellitus without complications: Secondary | ICD-10-CM

## 2021-06-03 DIAGNOSIS — E7849 Other hyperlipidemia: Secondary | ICD-10-CM

## 2021-06-03 DIAGNOSIS — Z0000 Encounter for general adult medical examination without abnormal findings: Secondary | ICD-10-CM

## 2021-06-03 LAB — POCT GLYCOSYLATED HEMOGLOBIN (HGB A1C): Hemoglobin A1C: 6.2 % — AB (ref 4.0–5.6)

## 2021-06-03 LAB — GLUCOSE, POCT (MANUAL RESULT ENTRY): POC Glucose: 111 mg/dl — AB (ref 70–99)

## 2021-06-03 MED ORDER — METFORMIN HCL 500 MG PO TABS
500.0000 mg | ORAL_TABLET | Freq: Every day | ORAL | 2 refills | Status: DC
  Filled 2021-06-03: qty 30, 30d supply, fill #0
  Filled 2021-06-30: qty 30, 30d supply, fill #1
  Filled 2021-07-27: qty 30, 30d supply, fill #2

## 2021-06-03 MED ORDER — PROPRANOLOL HCL 20 MG PO TABS
20.0000 mg | ORAL_TABLET | Freq: Two times a day (BID) | ORAL | 3 refills | Status: DC
  Filled 2021-06-03: qty 60, 30d supply, fill #0
  Filled 2021-06-30: qty 60, 30d supply, fill #1
  Filled 2021-07-27: qty 60, 30d supply, fill #2

## 2021-06-03 MED ORDER — ROSUVASTATIN CALCIUM 5 MG PO TABS
5.0000 mg | ORAL_TABLET | Freq: Every day | ORAL | 3 refills | Status: DC
  Filled 2021-06-03: qty 30, 30d supply, fill #0
  Filled 2021-06-30: qty 30, 30d supply, fill #1
  Filled 2021-07-27: qty 30, 30d supply, fill #2

## 2021-06-03 NOTE — Patient Instructions (Signed)
Plan de alimentacin DASH DASH Eating Plan DASH es la sigla en ingls de "Enfoques Alimentarios para Detener la Hipertensin". El plan de alimentacin DASH ha demostrado: Bajar la presin arterial elevada (hipertensin). Reducir el riesgo de diabetes tipo 2, enfermedad cardaca y accidente cerebrovascular. Ayudar a perder peso. Consejos para seguir este plan Leer las etiquetas de los alimentos Verifique la cantidad de sal (sodio) por porcin en las etiquetas de los alimentos. Elija alimentos con menos del 5 por ciento del valor diario de sodio. Generalmente, los alimentos con menos de 300 miligramos (mg) de sodio por porcin se encuadran dentro de este plan alimentario. Para encontrar cereales integrales, busque la palabra "integral" como primera palabra en la lista de ingredientes. Al ir de compras Compre productos en los que en su etiqueta diga: "bajo contenido de sodio" o "sin agregado de sal". Compre alimentos frescos. Evite los alimentos enlatados y comidas precocidas o congeladas. Al cocinar Evite agregar sal cuando cocine. Use hierbas o aderezos sin sal, en lugar de sal de mesa o sal marina. Consulte al mdico o farmacutico antes de usar sustitutos de la sal. No fra los alimentos. A la hora de cocinar los alimentos opte por hornearlos, hervirlos, grillarlos, asarlos al horno y asarlos a la parrilla. Cocine con aceites cardiosaludables, como oliva, canola, aguacate, soja o girasol. Planificacin de las comidas  Consuma una dieta equilibrada, que incluya lo siguiente: 4 o ms porciones de frutas y 4 o ms porciones de verduras por da. Trate de que medio plato de cada comida sea de frutas y verduras. De 6 a 8 porciones de cereales integrales todos los das. Menos de 6 onzas (170 g) de carne, aves o pescado magros por da. Una porcin de 3 onzas (85 g) de carne tiene casi el mismo tamao que un mazo de cartas. Un huevo equivale a 1 onza (28 g). De 2 a 3 porciones de productos lcteos  descremados por da. Una porcin es 1 taza (237 ml). 1 porcin de frutos secos, semillas o frijoles 5 veces por semana. De 2 a 3 porciones de grasas cardiosaludables. Las grasas saludables llamadas cidos grasos omega-3 se encuentran en alimentos como las nueces, las semillas de lino, las leches fortificadas y los huevos. Estas grasas tambin se encuentran en los pescados de agua fra, como la sardina, el salmn y la caballa. Limite la cantidad que consume de: Alimentos enlatados o envasados. Alimentos con alto contenido de grasa trans, como algunos alimentos fritos. Alimentos con alto contenido de grasa saturada, como carne con grasa. Postres y otros dulces, bebidas azucaradas y otros alimentos con azcar agregada. Productos lcteos enteros. No le agregue sal a los alimentos antes de probarlos. No coma ms de 4 yemas de huevo por semana. Trate de comer al menos 2 comidas vegetarianas por semana. Consuma ms comida casera y menos de restaurante, de bares y comida rpida. Estilo de vida Cuando coma en un restaurante, pida que preparen su comida con menos sal o, en lo posible, sin nada de sal. Si bebe alcohol: Limite la cantidad que bebe: De 0 a 1 medida por da para las mujeres que no estn embarazadas. De 0 a 2 medidas por da para los hombres. Est atento a la cantidad de alcohol que hay en las bebidas que toma. En los [place], una medida equivale a una botella de cerveza de 12 oz (355 ml), un vaso de vino de 5 oz (148 ml) o un vaso de una bebida alcohlica de alta graduacin de 1 oz (  44 ml). Informacin general Evite ingerir ms de 2300 mg de sal por da. Si tiene hipertensin, es posible que necesite reducir la ingesta de sodio a 1,500 mg por da. Trabaje con su mdico para mantener un peso saludable o perder peso. Pregntele cul es el peso recomendado para usted. Realice al menos 30 minutos de ejercicio que haga que se acelere su corazn (ejercicio aerbico) la mayora de los das  de la semana. Estas actividades pueden incluir caminar, nadar o andar en bicicleta. Trabaje con su mdico o nutricionista para ajustar su plan alimentario a sus necesidades calricas personales. Qu alimentos debo comer? Frutas Todas las frutas frescas, congeladas o disecadas. Frutas enlatadas en jugo natural (sin agregado de azcar). Verduras Verduras frescas o congeladas (crudas, al vapor, asadas o grilladas). Jugos de tomate y verduras con bajo contenido de sodio o reducidos en sodio. Salsa y pasta de tomate con bajo contenido de sodio o reducidas en sodio. Verduras enlatadas con bajo contenido de sodio o reducidas en sodio. Granos Pan de salvado o integral. Pasta de salvado o integral. Arroz integral. Avena. Quinua. Trigo burgol. Cereales integrales y con bajo contenido de sodio. Pan pita. Galletitas de agua con bajo contenido de grasa y sodio. Tortillas de harina integral. Carnes y otras protenas Pollo o pavo sin piel. Carne de pollo o de pavo molida. Cerdo desgrasado. Pescado y mariscos. Claras de huevo. Porotos, guisantes o lentejas secos. Frutos secos, mantequilla de frutos secos y semillas sin sal. Frijoles enlatados sin sal. Cortes de carne vacuna magra, desgrasada. Carne precocida o curada magra y baja en sodio, como embutidos o panes de carne. Lcteos Leche descremada (1 %) o descremada. Quesos reducidos en grasa, con bajo contenido de grasa o descremados. Queso blanco o ricota sin grasa, con bajo contenido de sodio. Yogur semidescremado o descremado. Queso con bajo contenido de grasa y sodio. Grasas y aceites Margarinas untables que no contengan grasas trans. Aceite vegetal. Mayonesa y aderezos para ensaladas livianos, reducidos en grasa o con bajo contenido de grasas (reducidos en sodio). Aceite de canola, crtamo, oliva, aguacate, soja y girasol. Aguacate. Alios y condimentos Hierbas. Especias. Mezclas de condimentos sin sal. Otros alimentos Palomitas de maz y pretzels sin sal.  Dulces con bajo contenido de grasas. Es posible que los productos que se enumeran ms arriba no constituyan una lista completa de los alimentos y las bebidas que puede tomar. Consulte a un nutricionista para obtener ms informacin. Qu alimentos debo evitar? Frutas Fruta enlatada en almbar liviano o espeso. Frutas cocidas en aceite. Frutas con salsa de crema o mantequilla. Verduras Verduras con crema o fritas. Verduras en salsa de queso. Verduras enlatadas regulares (que no sean con bajo contenido de sodio o reducidas en sodio). Pasta y salsa de tomates enlatadas regulares (que no sean con bajo contenido de sodio o reducidas en sodio). Jugos de tomate y verduras regulares (que no sean con bajo contenido de sodio o reducidos en sodio). Pepinillos. Aceitunas. Granos Productos de panificacin hechos con grasa, como medialunas, magdalenas y algunos panes. Comidas con arroz o pasta seca listas para usar. Carnes y otras protenas Cortes de carne con alto contenido de grasa. Costillas. Carne frita. Tocino. [place]adela, salame y otras carnes precocidas o curadas, como embutidos o panes de carne. Grasa de la espalda del cerdo (panceta). Salchicha de cerdo. Frutos secos y semillas con sal. Frijoles enlatados con agregado de sal. Pescado enlatado o ahumado. Huevos enteros o yemas. Pollo o pavo con piel. Lcteos Leche entera o al 2 %, crema   y [address] y mitad crema. Queso crema entero o con toda su grasa. Yogur entero o endulzado. Quesos con toda su grasa. Sustitutos de cremas no lcteas. Coberturas batidas. Quesos para untar y quesos procesados. Grasas y [employer]. Margarina en barra. Manteca de cerdo. Lardo. Mantequilla clarificada. Grasa de panceta. Aceites tropicales como aceite de coco, palmiste o palma. Alios y condimentos Sal de cebolla, sal de ajo, sal condimentada, sal de mesa y sal marina. Salsa [place]. Salsa trtara. Salsa barbacoa. Salsa teriyaki. Salsa de soja, incluso la que  tiene contenido reducido de [place]. Salsa de carne. Salsas en lata y envasadas. Salsa de pescado. Salsa de [place]. Salsa rosada. Rbanos picantes comprados en tiendas. [place]. Mostaza. Saborizantes y tiernizantes para carne. Caldo en cubitos. Salsas picantes. Adobos preelaborados o envasados. Aderezos para tacos preelaborados o envasados. Salsas de pepinillos. Aderezos comunes para ensalada. Otros alimentos Palomitas de maz y pretzels con sal. Es posible que los productos que se enumeran ms arriba no constituyan una lista completa de los alimentos y las bebidas que Personnel officer. Consulte a un nutricionista para obtener ms informacin. Dnde buscar ms informacin National Heart, Lung, and Blood Institute (Instituto Nacional del [place], los Pulmones y Risk manager): PopSteam.is American Heart Association (Asociacin Estadounidense del Corazn): www.heart.org Academy of Nutrition and Dietetics (Academia de Nutricin y Pension scheme manager): www.eatright.org National Kidney Foundation (Fundacin Nacional del Rin): www.kidney.org Resumen El plan de alimentacin DASH ha demostrado bajar la presin arterial elevada (hipertensin). Tambin puede reducir [employer] de diabetes tipo 2, enfermedad cardaca y accidente cerebrovascular. Cuando siga el plan de alimentacin DASH, trate de comer ms frutas frescas y verduras, cereales integrales, carnes magras, lcteos descremados y grasas cardiosaludables. Con el plan de alimentacin DASH, deber limitar el consumo de sal (sodio) a 2,300 mg por da. Si tiene hipertensin, es posible que necesite reducir la ingesta de sodio a 1,500 mg por da. Trabaje con su mdico o nutricionista para ajustar su plan alimentario a sus necesidades calricas personales. Esta informacin no tiene Theme park manager el consejo del mdico. Asegrese de hacerle al mdico cualquier pregunta que tenga. Document Revised: 07/19/2019 Document Reviewed: 07/19/2019 Elsevier Patient Education   2022 Elsevier Inc. Recuento de carbohidratos para la diabetes mellitus en los adultos Carbohydrate Counting for Diabetes Mellitus, Adult El recuento de carbohidratos es un mtodo para llevar un registro de la cantidad de carbohidratos que se ingieren. La ingesta de carbohidratos aumenta la cantidad de azcar (glucosa) en la sangre. El recuento de la cantidad de carbohidratos que ingiere mejora el control de su glucemia. Esto, a su vez, le ayuda a controlar su diabetes. Los carbohidratos se miden en gramos (g) por porcin. Es importante saber la cantidad de carbohidratos (en gramos o por tamao de porcin) que se puede ingerir en cada comida. Esto es Government social research officer. Un nutricionista puede ayudarlo a crear un plan de alimentacin y a calcular la cantidad de carbohidratos que debe ingerir en cada comida y colacin. Qu alimentos contienen carbohidratos? Los siguientes alimentos incluyen carbohidratos: Granos, como panes y cereales. Frijoles secos y productos con soja. Verduras con almidn, como papas, guisantes y maz. [name] y jugos de frutas. Leche y Dentist. Dulces y colaciones, como pasteles, galletas, caramelos, papas fritas de bolsa y refrescos. Cmo se calculan los carbohidratos de los alimentos? Hay dos maneras de calcular los carbohidratos de los alimentos. Puede leer las etiquetas de los alimentos o aprender cules son los tamaos de las porciones estndar de los alimentos. Puede usar cualquiera de estos mtodos  o una combinacin de ambos. Usar la etiqueta de informacin nutricional La lista de Informacin nutricional est incluida en las etiquetas de casi todas las bebidas y los alimentos envasados de los [place]. Esto incluye lo siguiente: El tamao de la porcin. Informacin sobre los nutrientes de cada porcin, incluidos los gramos de carbohidratos por porcin. Para usar la Informacin nutricional, decida cuntas porciones tomar. Luego, multiplique el nmero de  porciones por la cantidad de carbohidratos por porcin. El nmero resultante es la cantidad total de carbohidratos que comer. Conocer los tamaos de las porciones estndar de los alimentos Cuando coma alimentos que contengan carbohidratos y que no estn envasados o no incluyan la informacin nutricional en la etiqueta, debe medir las porciones para poder calcular los gramos de carbohidratos. Mida los alimentos que comer con una balanza de alimentos o una taza medidora, si es necesario. Decida cuntas porciones de tamao estndar comer. Multiplique el nmero de porciones por 15. En los alimentos que contienen carbohidratos, una porcin equivale a 15 g de carbohidratos. Por ejemplo, si come 2 tazas o 10 onzas (300 g) de fresas, habr comido 2 porciones y 30 g de carbohidratos (2 porciones x 15 g = 30 g). En el caso de las comidas que contienen mezclas de ms de un alimento, como las sopas y los guisos, debe calcular los carbohidratos de cada alimento que se incluye. La siguiente lista contiene los tamaos de porciones estndar de los alimentos ricos en carbohidratos ms comunes. Cada una de estas porciones tiene aproximadamente 15 g de carbohidratos: 1 rebanada de pan. 1 tortilla de seis pulgadas (15 cm). ? de taza o 2 onzas (53 g) de arroz o pasta cocidos.  taza o 3 onzas (85 g) de lentejas o frijoles cocidos o enlatados, escurridos y enjuagados.  taza o 3 onzas (85 g) de verduras con almidn, como guisantes, maz o zapallo.  taza o 4 onzas (120 g) de cereal caliente.  taza o 3 onzas (85 g) de papas hervidas o en pur, o  o 3 onzas (85 g) de una papa grande al horno.  taza o 4 onzas fluidas (118 ml) de jugo de frutas. 1 taza u 8 onzas fluidas (237 ml) de leche. 1 unidad pequea o 4 onzas (106 g) de manzana.  unidad o 2 onzas (63 g) de una banana mediana. 1 taza o 5 oz (150 g) de fresas. 3 tazas o 1 oz (28.3 g) de palomitas de maz. Cul sera un ejemplo de recuento de  carbohidratos? Para calcular los gramos de carbohidratos de este ejemplo de comida, siga los pasos que se describen a continuacin. Ejemplo de comida 3 onzas (85 g) de pechugas de pollo. ? de taza o 4 onzas (106 g) de arroz integral.  taza o 3 onzas (85 g) de maz. 1 taza u 8 onzas fluidas (237 ml) de leche. 1 taza o 5 onzas (150 g) de fresas con crema batida sin azcar. Clculo de carbohidratos Identifique los alimentos que contienen carbohidratos: Arroz. Maz. Leche. Fresas. Calcule cuntas porciones come de cada alimento: 2 porciones de arroz. 1 porcin de maz. 1 porcin de leche. 1 porcin de fresas. Multiplique cada nmero de porciones por 15 g: 2 porciones de arroz x 15 g = 30 g. 1 porcin de maz x 15 g = 15 g. 1 porcin de leche x 15 g = 15 g. 1 porcin de fresas x 15 g = 15 g. Sume todas las cantidades para conocer el total de gramos de carbohidratos   consumidos: °30 g + 15 g + 15 g + 15 g = 75 g de carbohidratos en total. °Consejos para seguir este plan °Al ir de compras °Elabore un plan de comidas y luego haga una lista de compras. °Compre verduras frescas y congeladas, frutas frescas y congeladas, productos lácteos, huevos, frijoles, lentejas y cereales integrales. °Fíjese en las etiquetas de los alimentos. Elija los alimentos que tengan más fibra y menos azúcar. °Evite los alimentos procesados y los alimentos con azúcares agregados. °Planificación de las comidas °Trate de consumir la misma cantidad de gramos de carbohidratos en cada comida y en cada colación. °Planifique tomar comidas y colaciones regulares y equilibradas. °Dónde buscar más información °American Diabetes Association (Asociación Estadounidense de la Diabetes): diabetes.org °Centers for Disease Control and Prevention (Centros para el Control y la Prevención de Enfermedades): cdc.gov °Academy of Nutrition and Dietetics (Academia de Nutrición y Dietética): eatright.org °Association of Diabetes Care & Education  Specialists (Asociación de Especialistas en Atención y Educación sobre la Diabetes): diabeteseducator.org °Resumen °El recuento de carbohidratos es un método para llevar un registro de la cantidad de carbohidratos que se ingieren. °La ingesta de carbohidratos aumenta la cantidad de azúcar (glucosa) en la sangre. °El recuento de la cantidad de carbohidratos que ingiere mejora el control de su glucemia. Esto le ayuda a controlar su diabetes. °Un nutricionista puede ayudarlo a crear un plan de alimentación y a calcular la cantidad de carbohidratos que debe ingerir en cada comida y colación. °Esta información no tiene como fin reemplazar el consejo del médico. Asegúrese de hacerle al médico cualquier pregunta que tenga. °Document Revised: 03/04/2020 Document Reviewed: 03/04/2020 °Elsevier Patient Education © 2022 Elsevier Inc. ° °
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
## 2021-06-08 ENCOUNTER — Other Ambulatory Visit: Payer: Self-pay

## 2021-06-30 ENCOUNTER — Other Ambulatory Visit: Payer: Self-pay | Admitting: Gerontology

## 2021-06-30 ENCOUNTER — Other Ambulatory Visit: Payer: Self-pay

## 2021-06-30 MED ORDER — OMEPRAZOLE 20 MG PO CPDR
20.0000 mg | DELAYED_RELEASE_CAPSULE | Freq: Every day | ORAL | 0 refills | Status: DC
  Filled 2021-06-30: qty 30, 30d supply, fill #0

## 2021-07-13 ENCOUNTER — Other Ambulatory Visit: Payer: Self-pay

## 2021-07-13 ENCOUNTER — Ambulatory Visit: Payer: Medicaid Other | Admitting: Pharmacy Technician

## 2021-07-13 DIAGNOSIS — Z79899 Other long term (current) drug therapy: Secondary | ICD-10-CM

## 2021-07-14 NOTE — Progress Notes (Signed)
Patient speaks Spanish.  Interpretation provided by [facility] Interpreter.  Patient stated that she would like information about Medicare Advantage Plans.  Had went to [employer] and was told to come back with someone who could speak more [place].  Explained to patient that I was not an Advertising account planner, so I could not advise her on which Medicare plan to select.  Asked patient if she knew an Advertising account planner.  Patient stated that she did not.  Patient asked me to contact Social Security Administration while there was a Theatre manager present.  Attempted to contact Optometrist.  However, they were closed due to [employer] holiday.  Patient stated that her daughter is fluent in both [place] and [place].  Is going to schedule an appointment with Social Security Administration in [place] when her daughter can accompany her.  Provided patient with information about the Medicare Savings Plan and L. I. S. (Low Income Subsidy) and where to go to be screened for each one of those.  Patient stated that [facility] was [facility] on Tuesday to be screened for the [employer].    Sherilyn Dacosta Care Manager [facility]

## 2021-07-27 ENCOUNTER — Other Ambulatory Visit: Payer: Self-pay

## 2021-07-27 ENCOUNTER — Other Ambulatory Visit: Payer: Self-pay | Admitting: Gerontology

## 2021-07-28 ENCOUNTER — Other Ambulatory Visit: Payer: Self-pay

## 2021-07-28 MED FILL — Omeprazole Cap Delayed Release 20 MG: ORAL | 21 days supply | Qty: 21 | Fill #0 | Status: AC

## 2021-07-29 ENCOUNTER — Other Ambulatory Visit: Payer: Self-pay

## 2021-08-12 ENCOUNTER — Other Ambulatory Visit: Payer: Medicaid Other

## 2021-08-19 ENCOUNTER — Other Ambulatory Visit: Payer: Self-pay

## 2021-08-19 ENCOUNTER — Other Ambulatory Visit: Payer: Medicaid Other

## 2021-08-19 DIAGNOSIS — R7303 Prediabetes: Secondary | ICD-10-CM

## 2021-08-19 DIAGNOSIS — Z0000 Encounter for general adult medical examination without abnormal findings: Secondary | ICD-10-CM

## 2021-08-20 LAB — COMPREHENSIVE METABOLIC PANEL
ALT: 30 IU/L (ref 0–32)
AST: 24 IU/L (ref 0–40)
Albumin/Globulin Ratio: 1.8 (ref 1.2–2.2)
Albumin: 4.5 g/dL (ref 3.8–4.8)
Alkaline Phosphatase: 81 IU/L (ref 44–121)
BUN/Creatinine Ratio: 18 (ref 12–28)
BUN: 11 mg/dL (ref 8–27)
Bilirubin Total: 0.3 mg/dL (ref 0.0–1.2)
CO2: 20 mmol/L (ref 20–29)
Calcium: 9.7 mg/dL (ref 8.7–10.3)
Chloride: 105 mmol/L (ref 96–106)
Creatinine, Ser: 0.61 mg/dL (ref 0.57–1.00)
Globulin, Total: 2.5 g/dL (ref 1.5–4.5)
Glucose: 100 mg/dL — ABNORMAL HIGH (ref 70–99)
Potassium: 4.7 mmol/L (ref 3.5–5.2)
Sodium: 140 mmol/L (ref 134–144)
Total Protein: 7 g/dL (ref 6.0–8.5)
eGFR: 100 mL/min/{1.73_m2} (ref >59)

## 2021-08-20 LAB — LIPID PANEL
Chol/HDL Ratio: 3 ratio (ref 0.0–4.4)
Cholesterol, Total: 133 mg/dL (ref 100–199)
HDL: 44 mg/dL (ref >39)
LDL Chol Calc (NIH): 70 mg/dL (ref 0–99)
Triglycerides: 103 mg/dL (ref 0–149)
VLDL Cholesterol Cal: 19 mg/dL (ref 5–40)

## 2021-08-20 LAB — CBC WITH DIFFERENTIAL/PLATELET
Basophils Absolute: 0.1 10*3/uL (ref 0.0–0.2)
Basos: 1 %
EOS (ABSOLUTE): 0.2 10*3/uL (ref 0.0–0.4)
Eos: 3 %
Hematocrit: 43.6 % (ref 34.0–46.6)
Hemoglobin: 14.4 g/dL (ref 11.1–15.9)
Immature Grans (Abs): 0 10*3/uL (ref 0.0–0.1)
Immature Granulocytes: 0 %
Lymphocytes Absolute: 2.6 10*3/uL (ref 0.7–3.1)
Lymphs: 49 %
MCH: 28.5 pg (ref 26.6–33.0)
MCHC: 33 g/dL (ref 31.5–35.7)
MCV: 86 fL (ref 79–97)
Monocytes Absolute: 0.5 10*3/uL (ref 0.1–0.9)
Monocytes: 9 %
Neutrophils Absolute: 2 10*3/uL (ref 1.4–7.0)
Neutrophils: 38 %
Platelets: 264 10*3/uL (ref 150–450)
RBC: 5.06 x10E6/uL (ref 3.77–5.28)
RDW: 14.1 % (ref 11.7–15.4)
WBC: 5.3 10*3/uL (ref 3.4–10.8)

## 2021-08-20 LAB — HEMOGLOBIN A1C
Est. average glucose Bld gHb Est-mCnc: 134 mg/dL
Hgb A1c MFr Bld: 6.3 % — ABNORMAL HIGH (ref 4.8–5.6)

## 2021-08-26 ENCOUNTER — Ambulatory Visit: Payer: Medicaid Other | Admitting: Gerontology

## 2021-08-27 ENCOUNTER — Other Ambulatory Visit: Payer: Self-pay

## 2021-09-01 ENCOUNTER — Other Ambulatory Visit: Payer: Self-pay

## 2021-09-01 ENCOUNTER — Encounter: Payer: Self-pay | Admitting: Gerontology

## 2021-09-01 ENCOUNTER — Ambulatory Visit: Payer: Medicaid Other | Admitting: Gerontology

## 2021-09-01 VITALS — BP 127/78 | HR 67 | Temp 98.4°F | Resp 16 | Ht 63.0 in | Wt 180.0 lb

## 2021-09-01 DIAGNOSIS — R7303 Prediabetes: Secondary | ICD-10-CM

## 2021-09-01 DIAGNOSIS — K219 Gastro-esophageal reflux disease without esophagitis: Secondary | ICD-10-CM

## 2021-09-01 DIAGNOSIS — E7849 Other hyperlipidemia: Secondary | ICD-10-CM

## 2021-09-01 DIAGNOSIS — I10 Essential (primary) hypertension: Secondary | ICD-10-CM

## 2021-09-01 MED ORDER — METFORMIN HCL 500 MG PO TABS
500.0000 mg | ORAL_TABLET | Freq: Every day | ORAL | 0 refills | Status: AC
  Filled 2021-09-01: qty 90, 90d supply, fill #0

## 2021-09-01 MED ORDER — OMEPRAZOLE 20 MG PO CPDR
20.0000 mg | DELAYED_RELEASE_CAPSULE | Freq: Every day | ORAL | 0 refills | Status: AC
  Filled 2021-09-01: qty 90, 90d supply, fill #0

## 2021-09-01 MED ORDER — PROPRANOLOL HCL 20 MG PO TABS
20.0000 mg | ORAL_TABLET | Freq: Two times a day (BID) | ORAL | 0 refills | Status: AC
  Filled 2021-09-01: qty 180, 90d supply, fill #0

## 2021-09-01 MED ORDER — ROSUVASTATIN CALCIUM 5 MG PO TABS
5.0000 mg | ORAL_TABLET | Freq: Every day | ORAL | 0 refills | Status: AC
  Filled 2021-09-01: qty 90, 90d supply, fill #0

## 2021-09-01 NOTE — Patient Instructions (Signed)
Plan de alimentacin cardiosaludable Heart-Healthy Eating Plan Muchos factores influyen en la salud del corazn (coronaria), entre ellos, los hbitos de alimentacin y de ejercicio fsico. El riesgo coronario aumenta cuando hay niveles anormales de grasa (lpidos) en la sangre. La planificacin de las comidas cardiosaludables implica limitar las grasas poco saludables, aumentar las grasas saludables y hacer otros cambios en la dieta y el estilo de vida. En qu consiste el plan? El mdico podra recomendarle que haga lo siguiente: Limitar la ingesta de grasas al _________ % o menos del total de caloras por da. Limitar la ingesta de grasas saturadas al _________ % o menos del total de caloras por da. Limitar la cantidad de colesterol en su dieta a menos de _________ mg por da. Cules son algunos consejos para seguir este plan? Al cocinar Evite frer los alimentos a la hora de la coccin. Hornear, hervir, grillar y asar a la parrilla son buenas opciones. Otras formas de reducir el consumo de grasas son las siguientes: Quite la piel de las aves. Quite todas las grasas visibles de las carnes. Cocine al vapor las verduras en agua o caldo. Planificacin de las comidas  En las comidas, imagine dividir su plato en cuartos: Llene la mitad del plato con verduras y ensaladas de hojas verdes. Llene un cuarto del plato con cereales integrales. Llene un cuarto del plato con alimentos con protenas magras. Coma 4 o 5 porciones de verduras por da. Una porcin equivale a una taza de verduras crudas o cocidas o a 2 tazas de verduras de hojas verdes crudas. Coma 4 o 5 porciones de frutas por da. Una porcin equivale a una fruta mediana entera,  taza de fruta desecada;  taza de frutas frescas, congeladas o enlatadas; o  taza de jugo 100 % de fruta. Consuma ms alimentos con fibra soluble. Entre ellos, se incluyen las manzanas, el brcoli, las zanahorias, los frijoles, los guisantes y la cebada. Trate de  consumir de 25 a 30 g de fibra por da. Aumente el consumo de legumbres, frutos secos y semillas a 4 o 5 porciones por semana. Una porcin de frijoles o legumbres secos equivale a  taza despus de su coccin, una porcin de frutos secos equivale a  de taza y una porcin de semillas equivale a 1 cucharada. Grasas Elija grasas saludables con mayor frecuencia. Elija las grasas monoinsaturadas y poliinsaturadas, como el aceite de oliva y el de canola, las semillas de lino, las nueces, las almendras y las semillas. Consuma ms grasas omega-3. Elija salmn, caballa, sardinas, atn, aceite de lino y semillas de lino molidas. Propngase comer pescado al menos dos veces por semana. Lea las etiquetas de los alimentos detenidamente para identificar los que contienen grasas trans o altas cantidades de grasas saturadas. Limite el consumo de grasas saturadas. Estas se encuentran en productos de origen animal, como carnes, mantequilla y crema. Las grasas saturadas de origen vegetal incluyen aceite de palma, de palmiste y de coco. Evite los alimentos con aceites parcialmente hidrogenados. Estos contienen grasas trans. Entre los ejemplos, se incluyen margarinas en barra, algunas margarinas untables, galletas dulces y saladas y otros productos horneados. Evite las comidas fritas. Informacin general Consuma ms comida casera y menos de restaurante, de bares y comida rpida. Limite o evite el alcohol. Limite los alimentos con alto contenido de almidn y azcar. Baje de peso si es necesario. Perder solo del 5 al 10 % de su peso corporal puede ayudarlo a mejorar su estado de salud general y a prevenir enfermedades,   como la diabetes y las enfermedades cardacas. Controle la ingesta de sal (sodio), especialmente si tiene presin arterial alta. Hable con el mdico acerca de cambiar la ingesta de sodio. Intente incorporar ms comidas vegetarianas cada semana. Qu alimentos puedo comer? Frutas Frutas frescas, en conserva  (en su jugo natural) o frutas congeladas. Verduras Verduras frescas o congeladas (crudas, al vapor, asadas o grilladas). Ensaladas de hojas verdes. Cereales La mayora de los cereales. Elija casi siempre trigo integral y cereales integrales. Arroz y pastas, incluido el arroz integral y las pastas elaboradas con trigo integral. Carnes y otras protenas Carnes magras de res, ternera, cerdo y cordero a las que se les haya quitado la grasa visible. Pollo y pavo sin piel. Todos los pescados y mariscos. Pato salvaje, conejo, faisn y venado. Claras de huevo o sustitutos del huevo bajos en colesterol. Porotos, guisantes y lentejas secos y tofu. Semillas y la mayora de los frutos secos. Lcteos Quesos descremados y semidescremados, entre ellos, ricota y mozzarella. Leche descremada o al 1 % (lquida, en polvo o evaporada). Suero de leche elaborado con leche semidescremada. Yogur descremado o semidescremado. Grasas y aceites Margarinas no hidrogenadas (sin grasas trans). Aceites vegetales, incluido el de soja, ssamo, girasol, oliva, man, crtamo, maz, canola y semillas de algodn. Alios para ensalada o mayonesa elaborados con aceite vegetal. Bebidas Agua (mineral o con gas). T y caf. Gaseosas dietticas. Dulces y postres Sorbete, gelatina y helado de frutas. Pequeas cantidades de chocolate amargo. Limite todos los dulces y postres. Alios y condimentos Todos los alios y condimentos. Es posible que los productos que se enumeran ms arriba no constituyan una lista completa de los alimentos y las bebidas que puede tomar. Consulte a un nutricionista para conocer ms opciones. Qu alimentos no se recomiendan? Frutas Fruta enlatada en almbar espeso. Frutas con salsa de crema o mantequilla. Frutas cocidas en aceite. Limite el consumo de coco. Verduras Verduras cocinadas con salsas de queso, crema o mantequilla. Verduras fritas. Cereales Panes elaborados con grasas saturadas o trans, aceites o  leche entera. Croissants. Panecillos dulces. Rosquillas. Galletas con alto contenido de grasas, como las que contienen queso. Carnes y otras protenas Carnes grasas, como perros calientes, costillas de res, salchichas, tocino, asado de costillar o chuletn. Fiambres con alto contenido de grasas, como salame y mortadela. Caviar. Pato y ganso domsticos. Vsceras, como hgado. Lcteos Crema, crema agria, queso crema y queso cottage con crema. Quesos enteros. Leche entera o al 2 % (lquida, evaporada o condensada). Suero de leche entero. Salsa de crema o queso con alto contenido de grasas. Yogur de leche entera. Grasas y aceites Grasa de carne o materia grasa. Manteca de cacao, aceites hidrogenados, aceite de palma, aceite de coco, aceite de palmiste. Grasas y materias grasas slidas, incluida la grasa del tocino, el cerdo salado, la manteca de cerdo y la mantequilla. Sustitutos no lcteos de la crema. Aderezos para ensalada con queso o crema agria. Bebidas Refrescos regulares y cualquier bebida con agregado de azcar. Dulces y postres Glaseados. Pudin. Galletas dulces. Bizcochuelos. Pasteles. Chocolate con leche o chocolate blanco. Almbares con mantequilla. Helados o bebidas elaboradas con helado con alto contenido de grasas. Es posible que los productos que se enumeran ms arriba no sean una lista completa de los alimentos y las bebidas que se deben evitar. Consulte a un nutricionista para obtener ms informacin. Resumen La planificacin de las comidas cardiosaludables implica limitar las grasas poco saludables, aumentar las grasas saludables y hacer otros cambios en la dieta y el estilo de   vida. Baje de peso si es necesario. Perder solo del 5 al 10 % de su peso corporal puede ayudarlo a mejorar su estado de salud general y a prevenir enfermedades, como la diabetes y las enfermedades cardacas. Propngase seguir una dieta equilibrada, que incluya frutas y verduras, productos lcteos descremados o  semidescremados, protenas magras, frutos secos y legumbres, cereales integrales y aceites y grasas cardiosaludables. Esta informacin no tiene como fin reemplazar el consejo del mdico. Asegrese de hacerle al mdico cualquier pregunta que tenga. Document Revised: 10/24/2020 Document Reviewed: 10/24/2020 Elsevier Patient Education  2022 Elsevier Inc. Plan de alimentacin DASH DASH Eating Plan DASH es la sigla en ingls de "Enfoques Alimentarios para Detener la Hipertensin". El plan de alimentacin DASH ha demostrado: Bajar la presin arterial elevada (hipertensin). Reducir el riesgo de diabetes tipo 2, enfermedad cardaca y accidente cerebrovascular. Ayudar a perder peso. Consejos para seguir este plan Leer las etiquetas de los alimentos Verifique la cantidad de sal (sodio) por porcin en las etiquetas de los alimentos. Elija alimentos con menos del 5 por ciento del valor diario de sodio. Generalmente, los alimentos con menos de 300 miligramos (mg) de sodio por porcin se encuadran dentro de este plan alimentario. Para encontrar cereales integrales, busque la palabra "integral" como primera palabra en la lista de ingredientes. Al ir de compras Compre productos en los que en su etiqueta diga: "bajo contenido de sodio" o "sin agregado de sal". Compre alimentos frescos. Evite los alimentos enlatados y comidas precocidas o congeladas. Al cocinar Evite agregar sal cuando cocine. Use hierbas o aderezos sin sal, en lugar de sal de mesa o sal marina. Consulte al mdico o farmacutico antes de usar sustitutos de la sal. No fra los alimentos. A la hora de cocinar los alimentos opte por hornearlos, hervirlos, grillarlos, asarlos al horno y asarlos a la parrilla. Cocine con aceites cardiosaludables, como oliva, canola, aguacate, soja o girasol. Planificacin de las comidas  Consuma una dieta equilibrada, que incluya lo siguiente: 4 o ms porciones de frutas y 4 o ms porciones de verduras por da.  Trate de que medio plato de cada comida sea de frutas y verduras. De 6 a 8 porciones de cereales integrales todos los das. Menos de 6 onzas (170 g) de carne, aves o pescado magros por da. Una porcin de 3 onzas (85 g) de carne tiene casi el mismo tamao que un mazo de cartas. Un huevo equivale a 1 onza (28 g). De 2 a 3 porciones de productos lcteos descremados por da. Una porcin es 1 taza (237 ml). 1 porcin de frutos secos, semillas o frijoles 5 veces por semana. De 2 a 3 porciones de grasas cardiosaludables. Las grasas saludables llamadas cidos grasos omega-3 se encuentran en alimentos como las nueces, las semillas de lino, las leches fortificadas y los huevos. Estas grasas tambin se encuentran en los pescados de agua fra, como la sardina, el salmn y la caballa. Limite la cantidad que consume de: Alimentos enlatados o envasados. Alimentos con alto contenido de grasa trans, como algunos alimentos fritos. Alimentos con alto contenido de grasa saturada, como carne con grasa. Postres y otros dulces, bebidas azucaradas y otros alimentos con azcar agregada. Productos lcteos enteros. No le agregue sal a los alimentos antes de probarlos. No coma ms de 4 yemas de huevo por semana. Trate de comer al menos 2 comidas vegetarianas por semana. Consuma ms comida casera y menos de restaurante, de bares y comida rpida. Estilo de vida Cuando coma en un restaurante, pida   que preparen su comida con menos sal o, en lo posible, sin nada de sal. Si bebe alcohol: Limite la cantidad que bebe: De 0 a 1 medida por da para las mujeres que no estn embarazadas. De 0 a 2 medidas por da para los hombres. Est atento a la cantidad de alcohol que hay en las bebidas que toma. En [place], una medida equivale a una botella de cerveza de 12 oz (355 ml), un vaso de vino de 5 oz (148 ml) o un vaso de una bebida alcohlica de alta graduacin de 1 oz (44 ml). Informacin general Evite ingerir ms de  2300 mg de sal por da. Si tiene hipertensin, es posible que necesite reducir la ingesta de sodio a 1,500 mg por da. Trabaje con su mdico para mantener un peso saludable o perder peso. Pregntele cul es el peso recomendado para usted. Realice al menos 30 minutos de ejercicio que haga que se acelere su corazn (ejercicio aerbico) la mayora de los das de la semana. Estas actividades pueden incluir caminar, nadar o andar en bicicleta. Trabaje con su mdico o nutricionista para ajustar su plan alimentario a sus necesidades calricas personales. Qu alimentos debo comer? Frutas Todas las frutas frescas, congeladas o disecadas. Frutas enlatadas en jugo natural (sin agregado de azcar). Verduras Verduras frescas o congeladas (crudas, al vapor, asadas o grilladas). Jugos de tomate y verduras con bajo contenido de sodio o reducidos en sodio. Salsa y pasta de tomate con bajo contenido de sodio o reducidas en sodio. Verduras enlatadas con bajo contenido de sodio o reducidas en sodio. Granos Pan de salvado o integral. Pasta de salvado o integral. Arroz integral. Avena. Quinua. Trigo burgol. Cereales integrales y con bajo contenido de sodio. Pan pita. Galletitas de agua con bajo contenido de grasa y sodio. Tortillas de harina integral. Carnes y otras protenas Pollo o pavo sin piel. Carne de pollo o de pavo molida. Cerdo desgrasado. Pescado y mariscos. Claras de huevo. Porotos, guisantes o lentejas secos. Frutos secos, mantequilla de frutos secos y semillas sin sal. Frijoles enlatados sin sal. Cortes de carne vacuna magra, desgrasada. Carne precocida o curada magra y baja en sodio, como embutidos o panes de carne. Lcteos Leche descremada (1 %) o descremada. Quesos reducidos en grasa, con bajo contenido de grasa o descremados. Queso blanco o ricota sin grasa, con bajo contenido de sodio. Yogur semidescremado o descremado. Queso con bajo contenido de grasa y sodio. Grasas y aceites Margarinas untables que  no contengan grasas trans. Aceite vegetal. Mayonesa y aderezos para ensaladas livianos, reducidos en grasa o con bajo contenido de grasas (reducidos en sodio). Aceite de canola, crtamo, oliva, aguacate, soja y girasol. Aguacate. Alios y condimentos Hierbas. Especias. Mezclas de condimentos sin sal. Otros alimentos Palomitas de maz y pretzels sin sal. Dulces con bajo contenido de grasas. Es posible que los productos que se enumeran ms arriba no constituyan una lista completa de los alimentos y las bebidas que puede tomar. Consulte a un nutricionista para obtener ms informacin. Qu alimentos debo evitar? Frutas Fruta enlatada en almbar liviano o espeso. Frutas cocidas en aceite. Frutas con salsa de crema o mantequilla. Verduras Verduras con crema o fritas. Verduras en salsa de queso. Verduras enlatadas regulares (que no sean con bajo contenido de sodio o reducidas en sodio). Pasta y salsa de tomates enlatadas regulares (que no sean con bajo contenido de sodio o reducidas en sodio). Jugos de tomate y verduras regulares (que no sean con bajo contenido de sodio o   reducidos en sodio). Pepinillos. Aceitunas. Granos Productos de panificacin hechos con grasa, como medialunas, magdalenas y algunos panes. Comidas con arroz o pasta seca listas para usar. Carnes y otras protenas Cortes de carne con alto contenido de grasa. Costillas. Carne frita. Tocino. Mortadela, salame y otras carnes precocidas o curadas, como embutidos o panes de carne. Grasa de la espalda del cerdo (panceta). Salchicha de cerdo. Frutos secos y semillas con sal. Frijoles enlatados con agregado de sal. Pescado enlatado o ahumado. Huevos enteros o yemas. Pollo o pavo con piel. Lcteos Leche entera o al 2 %, crema y mitad leche y mitad crema. Queso crema entero o con toda su grasa. Yogur entero o endulzado. Quesos con toda su grasa. Sustitutos de cremas no lcteas. Coberturas batidas. Quesos para untar y quesos procesados. Grasas y  aceites Mantequilla. Margarina en barra. Manteca de cerdo. Lardo. Mantequilla clarificada. Grasa de panceta. Aceites tropicales como aceite de coco, palmiste o palma. Alios y condimentos Sal de cebolla, sal de ajo, sal condimentada, sal de mesa y sal marina. Salsa [place]. Salsa trtara. Salsa barbacoa. Salsa teriyaki. Salsa de soja, incluso la que tiene contenido reducido de sodio. Salsa de carne. Salsas en lata y envasadas. Salsa de pescado. Salsa de ostras. Salsa rosada. Rbanos picantes comprados en tiendas. [place]. Mostaza. Saborizantes y tiernizantes para carne. Caldo en cubitos. Salsas picantes. Adobos preelaborados o envasados. Aderezos para tacos preelaborados o envasados. Salsas de pepinillos. Aderezos comunes para ensalada. Otros alimentos Palomitas de maz y pretzels con sal. Es posible que los productos que se enumeran ms arriba no constituyan una lista completa de los alimentos y las bebidas que debe evitar. Consulte a un nutricionista para obtener ms informacin. Dnde buscar ms informacin National Heart, Lung, and Blood Institute (Instituto Nacional del [place], los Pulmones y la Sangre): www.nhlbi.nih.gov American Heart Association (Asociacin Estadounidense del [place]): www.heart.org Academy of Nutrition and Dietetics (Academia de Nutricin y Diettica): www.eatright.org National Kidney Foundation (Fundacin Nacional del Rin): www.kidney.org Resumen El plan de alimentacin DASH ha demostrado bajar la presin arterial elevada (hipertensin). Tambin puede reducir el riesgo de diabetes tipo 2, enfermedad cardaca y accidente cerebrovascular. Cuando siga el plan de alimentacin DASH, trate de comer ms frutas frescas y verduras, cereales integrales, carnes magras, lcteos descremados y grasas cardiosaludables. Con el plan de alimentacin DASH, deber limitar el consumo de sal (sodio) a 2,300 mg por da. Si tiene hipertensin, es posible que necesite reducir la ingesta de  sodio a 1,500 mg por da. Trabaje con su mdico o nutricionista para ajustar su plan alimentario a sus necesidades calricas personales. Esta informacin no tiene como fin reemplazar el consejo del mdico. Asegrese de hacerle al mdico cualquier pregunta que tenga. Document Revised: 07/19/2019 Document Reviewed: 07/19/2019 Elsevier Patient Education  2022 Elsevier Inc.  
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
## 2021-10-05 ENCOUNTER — Other Ambulatory Visit: Payer: Self-pay

## 2022-06-10 IMAGING — MG MM DIGITAL SCREENING BILAT W/ TOMO AND CAD
8 series · 8 of 24 positions shown · non-contrast
Comparison: Previous exam(s).

CLINICAL DATA: Screening.

EXAM:
DIGITAL SCREENING BILATERAL MAMMOGRAM WITH TOMOSYNTHESIS AND CAD
TECHNIQUE: Bilateral screening digital craniocaudal and mediolateral oblique
mammograms were obtained. Bilateral screening digital breast
tomosynthesis was performed. The images were evaluated with
computer-aided detection.

[L CC synth-2D]
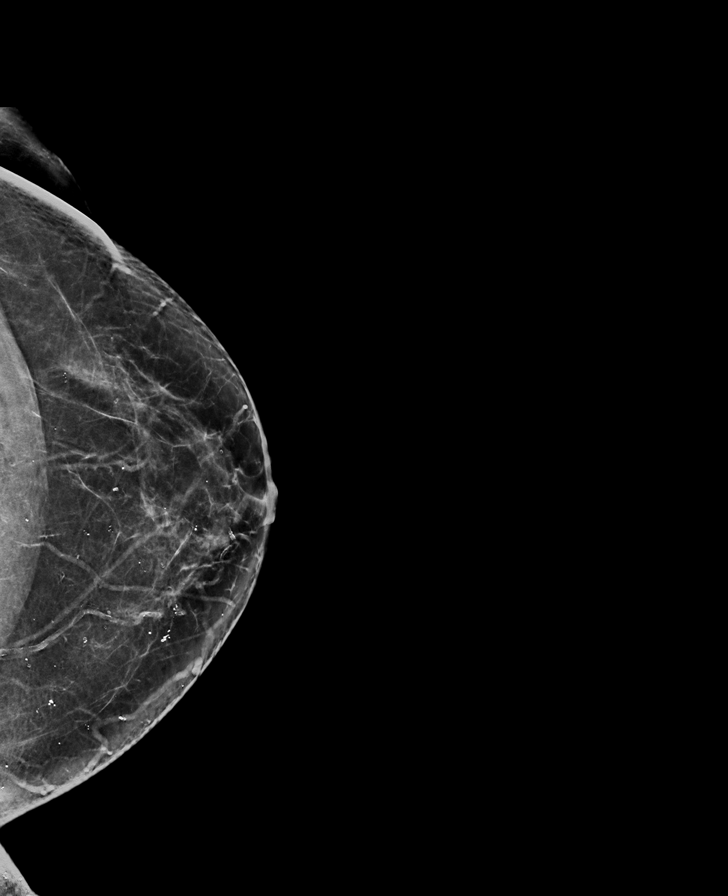

[R CC synth-2D]
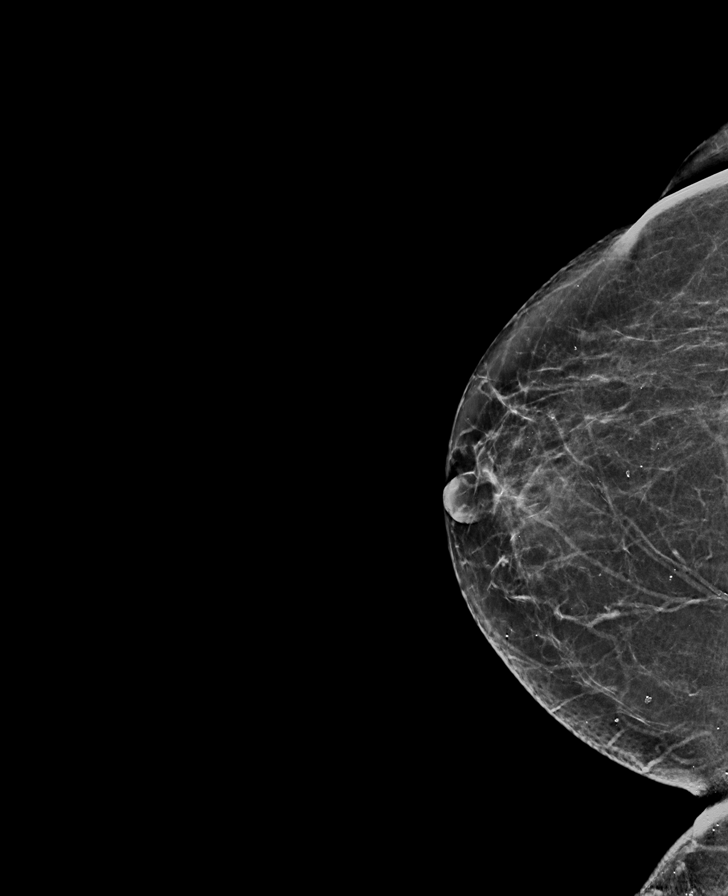

[L MLO synth-2D]
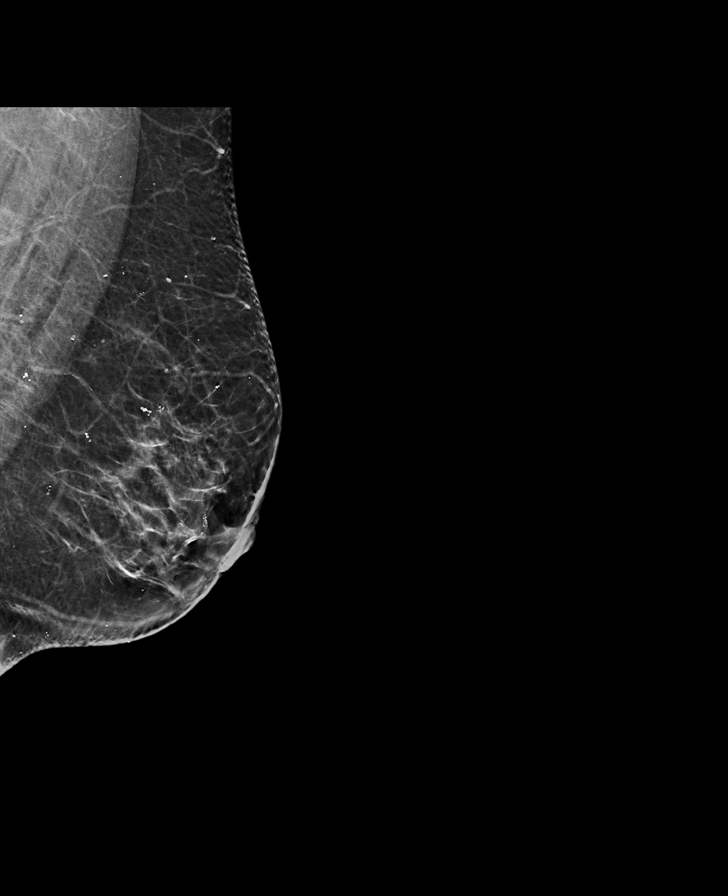

[R MLO synth-2D]
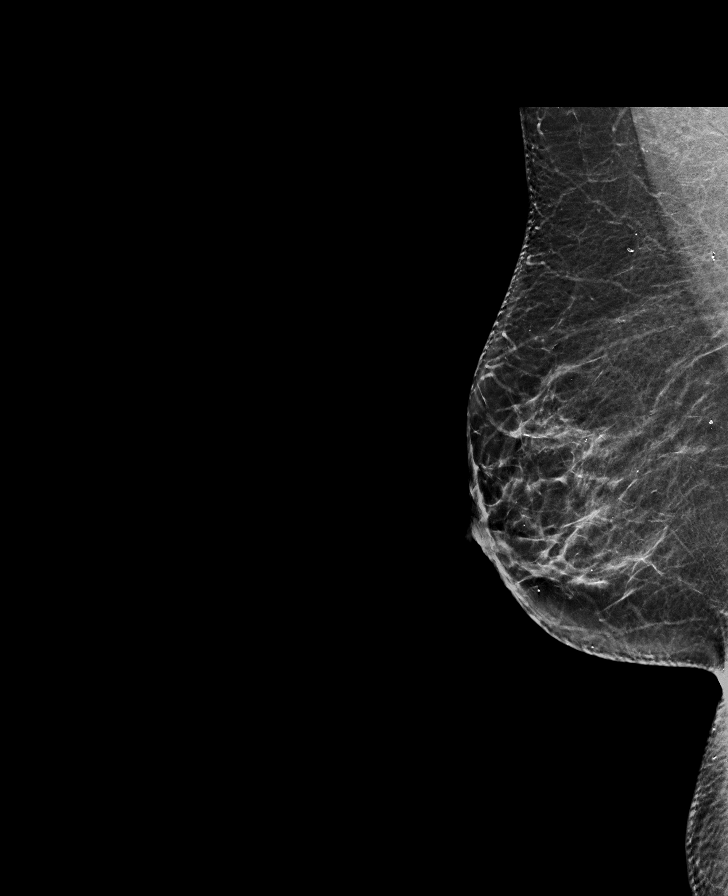

[R MLO tomo · tomo slice 35/68.0]
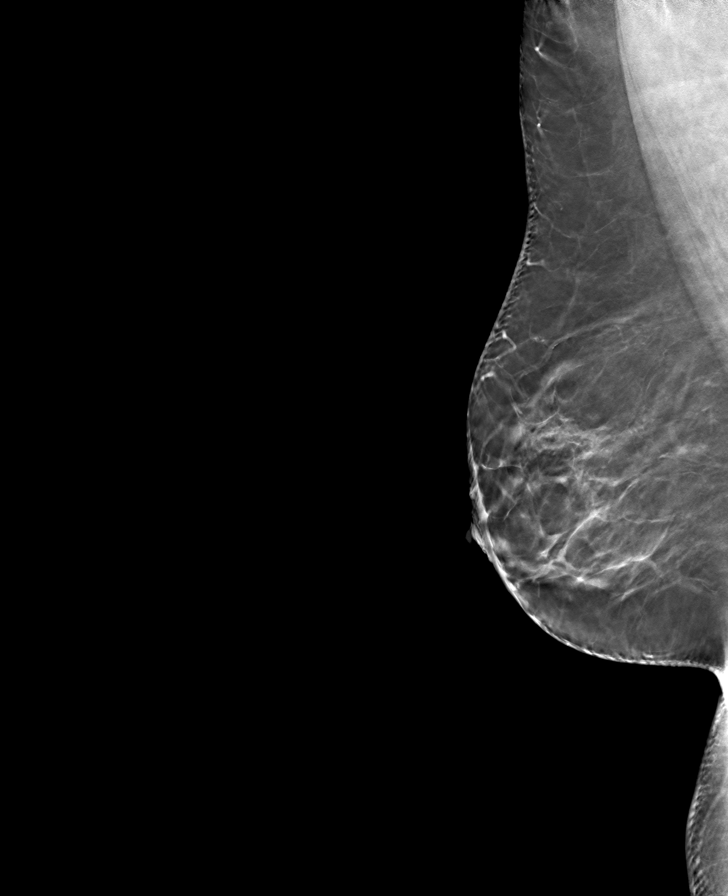

[R CC tomo · tomo slice 31/62.0]
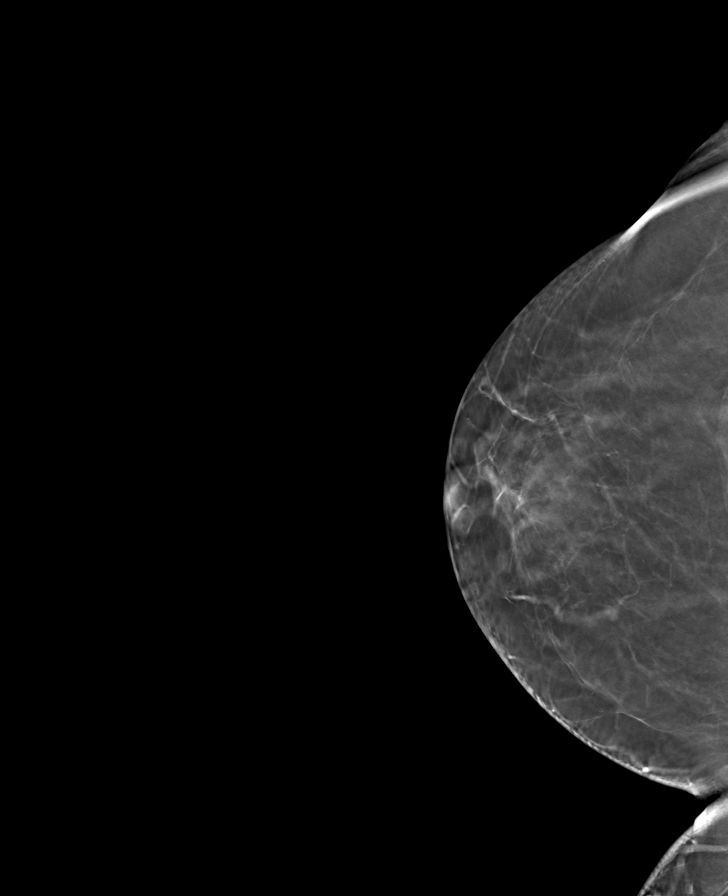

[L MLO tomo · tomo slice 33/66.0]
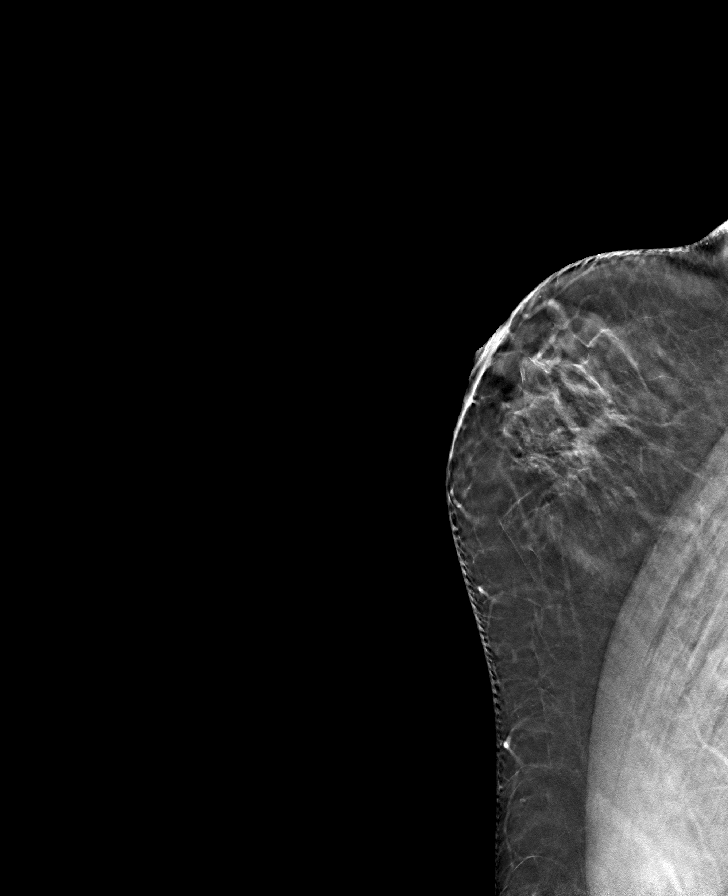

[L CC tomo · tomo slice 35/68.0]
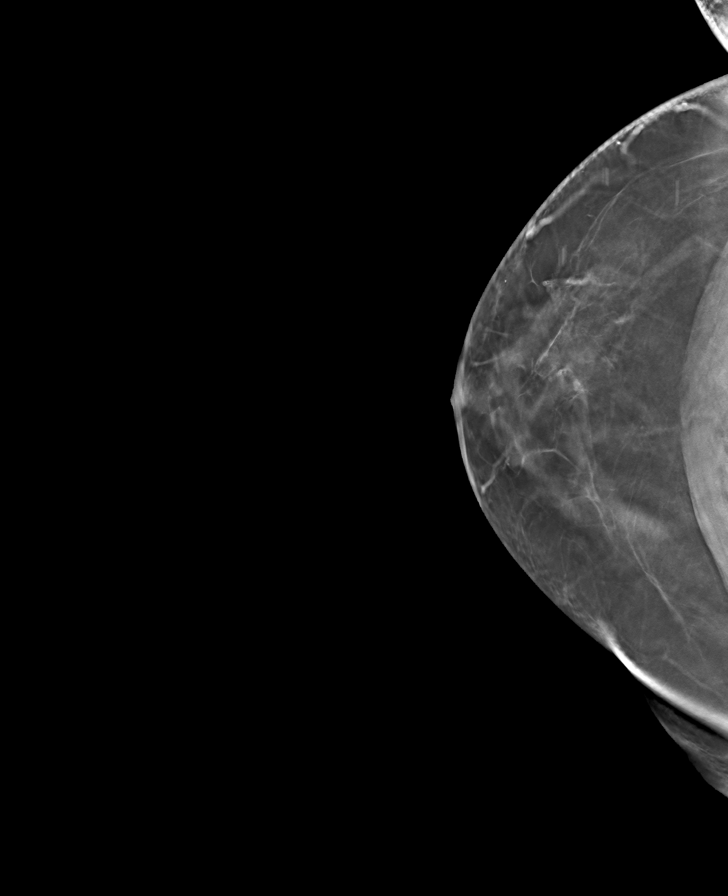

[8 of 24 positions shown; findings below may reference images not displayed]

ACR Breast Density Category b: There are scattered areas of
fibroglandular density.
FINDINGS: There are no findings suspicious for malignancy.
IMPRESSION: No mammographic evidence of malignancy. A result letter of this
screening mammogram will be mailed directly to the patient.

RECOMMENDATION:
Screening mammogram in one year. (Code:51-O-LD2)

BI-RADS CATEGORY  1: Negative.

## 2022-10-15 ENCOUNTER — Other Ambulatory Visit: Payer: Self-pay | Admitting: Neurology

## 2022-10-15 DIAGNOSIS — R2689 Other abnormalities of gait and mobility: Secondary | ICD-10-CM

## 2022-10-15 DIAGNOSIS — H9313 Tinnitus, bilateral: Secondary | ICD-10-CM

## 2022-10-28 DIAGNOSIS — R262 Difficulty in walking, not elsewhere classified: Secondary | ICD-10-CM | POA: Diagnosis not present

## 2022-11-02 ENCOUNTER — Encounter: Payer: Self-pay | Admitting: Neurology

## 2022-11-04 DIAGNOSIS — R262 Difficulty in walking, not elsewhere classified: Secondary | ICD-10-CM | POA: Diagnosis not present

## 2022-11-05 ENCOUNTER — Ambulatory Visit
Admission: RE | Admit: 2022-11-05 | Discharge: 2022-11-05 | Disposition: A | Discharge status: Home / Self Care | Payer: Medicare HMO | Source: Ambulatory Visit | Attending: Neurology | Admitting: Neurology

## 2022-11-05 DIAGNOSIS — R2689 Other abnormalities of gait and mobility: Secondary | ICD-10-CM

## 2022-11-05 DIAGNOSIS — H9319 Tinnitus, unspecified ear: Secondary | ICD-10-CM | POA: Diagnosis not present

## 2022-11-05 DIAGNOSIS — H9313 Tinnitus, bilateral: Secondary | ICD-10-CM

## 2022-11-05 MED ORDER — GADOPICLENOL 0.5 MMOL/ML IV SOLN
8.0000 mL | Freq: Once | INTRAVENOUS | Status: AC | PRN
  Administered 2022-11-05: 8 mL via INTRAVENOUS

## 2022-11-11 DIAGNOSIS — H524 Presbyopia: Secondary | ICD-10-CM | POA: Diagnosis not present

## 2022-11-11 DIAGNOSIS — R262 Difficulty in walking, not elsewhere classified: Secondary | ICD-10-CM | POA: Diagnosis not present

## 2023-01-12 DIAGNOSIS — M7751 Other enthesopathy of right foot: Secondary | ICD-10-CM | POA: Diagnosis not present

## 2023-01-12 DIAGNOSIS — B351 Tinea unguium: Secondary | ICD-10-CM | POA: Diagnosis not present

## 2023-01-12 DIAGNOSIS — M2041 Other hammer toe(s) (acquired), right foot: Secondary | ICD-10-CM | POA: Diagnosis not present

## 2023-01-12 DIAGNOSIS — M216X1 Other acquired deformities of right foot: Secondary | ICD-10-CM | POA: Diagnosis not present

## 2023-01-12 DIAGNOSIS — M79671 Pain in right foot: Secondary | ICD-10-CM | POA: Diagnosis not present

## 2023-01-14 DIAGNOSIS — Z124 Encounter for screening for malignant neoplasm of cervix: Secondary | ICD-10-CM | POA: Diagnosis not present

## 2023-01-14 DIAGNOSIS — Z1231 Encounter for screening mammogram for malignant neoplasm of breast: Secondary | ICD-10-CM | POA: Diagnosis not present

## 2023-02-03 DIAGNOSIS — L80 Vitiligo: Secondary | ICD-10-CM | POA: Diagnosis not present

## 2023-02-14 DIAGNOSIS — R2689 Other abnormalities of gait and mobility: Secondary | ICD-10-CM | POA: Diagnosis not present

## 2023-02-14 DIAGNOSIS — R42 Dizziness and giddiness: Secondary | ICD-10-CM | POA: Diagnosis not present

## 2023-02-14 DIAGNOSIS — R0683 Snoring: Secondary | ICD-10-CM | POA: Diagnosis not present

## 2023-02-14 DIAGNOSIS — H9313 Tinnitus, bilateral: Secondary | ICD-10-CM | POA: Diagnosis not present

## 2023-02-18 DIAGNOSIS — I10 Essential (primary) hypertension: Secondary | ICD-10-CM | POA: Diagnosis not present

## 2023-02-18 DIAGNOSIS — R079 Chest pain, unspecified: Secondary | ICD-10-CM | POA: Diagnosis not present

## 2023-02-18 DIAGNOSIS — Z758 Other problems related to medical facilities and other health care: Secondary | ICD-10-CM | POA: Diagnosis not present

## 2023-02-18 DIAGNOSIS — E119 Type 2 diabetes mellitus without complications: Secondary | ICD-10-CM | POA: Diagnosis not present

## 2023-02-18 DIAGNOSIS — E782 Mixed hyperlipidemia: Secondary | ICD-10-CM | POA: Diagnosis not present

## 2023-03-14 DIAGNOSIS — I10 Essential (primary) hypertension: Secondary | ICD-10-CM | POA: Diagnosis not present

## 2023-03-16 DIAGNOSIS — R079 Chest pain, unspecified: Secondary | ICD-10-CM | POA: Diagnosis not present

## 2023-03-23 DIAGNOSIS — R9439 Abnormal result of other cardiovascular function study: Secondary | ICD-10-CM | POA: Diagnosis not present

## 2023-03-23 DIAGNOSIS — Z8249 Family history of ischemic heart disease and other diseases of the circulatory system: Secondary | ICD-10-CM | POA: Diagnosis not present

## 2023-03-23 DIAGNOSIS — E782 Mixed hyperlipidemia: Secondary | ICD-10-CM | POA: Diagnosis not present

## 2023-03-23 DIAGNOSIS — I10 Essential (primary) hypertension: Secondary | ICD-10-CM | POA: Diagnosis not present

## 2023-03-23 DIAGNOSIS — R002 Palpitations: Secondary | ICD-10-CM | POA: Diagnosis not present

## 2023-03-23 DIAGNOSIS — G479 Sleep disorder, unspecified: Secondary | ICD-10-CM | POA: Diagnosis not present

## 2023-03-23 DIAGNOSIS — R7303 Prediabetes: Secondary | ICD-10-CM | POA: Diagnosis not present

## 2023-04-18 ENCOUNTER — Other Ambulatory Visit: Payer: Self-pay

## 2023-04-25 DIAGNOSIS — R9439 Abnormal result of other cardiovascular function study: Secondary | ICD-10-CM | POA: Diagnosis not present

## 2023-04-25 DIAGNOSIS — I10 Essential (primary) hypertension: Secondary | ICD-10-CM | POA: Diagnosis not present

## 2023-04-25 DIAGNOSIS — Z8249 Family history of ischemic heart disease and other diseases of the circulatory system: Secondary | ICD-10-CM | POA: Diagnosis not present

## 2023-04-25 DIAGNOSIS — E782 Mixed hyperlipidemia: Secondary | ICD-10-CM | POA: Diagnosis not present

## 2023-05-02 ENCOUNTER — Other Ambulatory Visit: Payer: Self-pay | Admitting: Nurse Practitioner

## 2023-05-02 DIAGNOSIS — I10 Essential (primary) hypertension: Secondary | ICD-10-CM

## 2023-05-02 DIAGNOSIS — R9439 Abnormal result of other cardiovascular function study: Secondary | ICD-10-CM

## 2023-05-03 ENCOUNTER — Telehealth (HOSPITAL_COMMUNITY): Payer: Self-pay | Admitting: *Deleted

## 2023-05-03 NOTE — Telephone Encounter (Signed)
Attempted to call patient regarding upcoming cardiac CT appointment (via interpreter ID # [phone]) Left message on voicemail with name and callback number Johney Frame RN Navigator Cardiac Imaging [facility] [phone] Office

## 2023-05-03 NOTE — Telephone Encounter (Signed)
Reaching out to patient to offer assistance regarding upcoming cardiac imaging study; pt verbalizes understanding of appt date/time, parking situation and where to check in, pre-test NPO status and medications ordered, and verified current allergies; name and call back number provided for further questions should they arise Johney Frame RN Navigator Cardiac Imaging [facility] [phone] office [phone] cell  Via spanish interpreter ID# [phone]  Patient reports HR 60s, denies contrast allergy.

## 2023-05-04 ENCOUNTER — Ambulatory Visit
Admission: RE | Admit: 2023-05-04 | Discharge: 2023-05-04 | Disposition: A | Discharge status: Home / Self Care | Payer: Medicare HMO | Source: Ambulatory Visit | Attending: Nurse Practitioner | Admitting: Nurse Practitioner

## 2023-05-04 DIAGNOSIS — R9439 Abnormal result of other cardiovascular function study: Secondary | ICD-10-CM | POA: Diagnosis present

## 2023-05-04 DIAGNOSIS — I2510 Atherosclerotic heart disease of native coronary artery without angina pectoris: Secondary | ICD-10-CM | POA: Diagnosis not present

## 2023-05-04 DIAGNOSIS — I10 Essential (primary) hypertension: Secondary | ICD-10-CM | POA: Insufficient documentation

## 2023-05-04 MED ORDER — METOPROLOL TARTRATE 5 MG/5ML IV SOLN
10.0000 mg | Freq: Once | INTRAVENOUS | Status: DC
  Filled 2023-05-04: qty 10

## 2023-05-04 MED ORDER — NITROGLYCERIN 0.4 MG SL SUBL
0.8000 mg | SUBLINGUAL_TABLET | Freq: Once | SUBLINGUAL | Status: AC
  Administered 2023-05-04: 0.8 mg via SUBLINGUAL
  Filled 2023-05-04: qty 25

## 2023-05-04 MED ORDER — IOHEXOL 350 MG/ML SOLN
80.0000 mL | Freq: Once | INTRAVENOUS | Status: AC | PRN
  Administered 2023-05-04: 80 mL via INTRAVENOUS

## 2023-05-04 NOTE — Progress Notes (Signed)
Pt tolerated procedure well with no issues. Pt ABCs intact. Pt denies any complaints. Pt encouraged to drink plenty of water throughout the day. Pt ambulatory with steady gait.

## 2023-05-09 ENCOUNTER — Emergency Department
Admission: EM | Admit: 2023-05-09 | Discharge: 2023-05-09 | Disposition: A | Discharge status: Home / Self Care | Payer: Medicare HMO | Attending: Emergency Medicine | Admitting: Emergency Medicine

## 2023-05-09 ENCOUNTER — Emergency Department: Payer: Medicare HMO

## 2023-05-09 DIAGNOSIS — R079 Chest pain, unspecified: Secondary | ICD-10-CM | POA: Diagnosis not present

## 2023-05-09 DIAGNOSIS — I2510 Atherosclerotic heart disease of native coronary artery without angina pectoris: Secondary | ICD-10-CM | POA: Insufficient documentation

## 2023-05-09 DIAGNOSIS — I700 Atherosclerosis of aorta: Secondary | ICD-10-CM | POA: Diagnosis not present

## 2023-05-09 DIAGNOSIS — R0789 Other chest pain: Secondary | ICD-10-CM | POA: Diagnosis not present

## 2023-05-09 DIAGNOSIS — R918 Other nonspecific abnormal finding of lung field: Secondary | ICD-10-CM | POA: Diagnosis not present

## 2023-05-09 DIAGNOSIS — I10 Essential (primary) hypertension: Secondary | ICD-10-CM | POA: Insufficient documentation

## 2023-05-09 HISTORY — DX: Hyperlipidemia, unspecified: E78.5

## 2023-05-09 LAB — CBC
HCT: 39.2 % (ref 36.0–46.0)
Hemoglobin: 13.3 g/dL (ref 12.0–15.0)
MCH: 29 pg (ref 26.0–34.0)
MCHC: 33.9 g/dL (ref 30.0–36.0)
MCV: 85.4 fL (ref 80.0–100.0)
Platelets: 225 10*3/uL (ref 150–400)
RBC: 4.59 MIL/uL (ref 3.87–5.11)
RDW: 13.2 % (ref 11.5–15.5)
WBC: 9.3 10*3/uL (ref 4.0–10.5)
nRBC: 0 % (ref 0.0–0.2)

## 2023-05-09 LAB — BASIC METABOLIC PANEL
Anion gap: 8 (ref 5–15)
BUN: 9 mg/dL (ref 8–23)
CO2: 21 mmol/L — ABNORMAL LOW (ref 22–32)
Calcium: 9.1 mg/dL (ref 8.9–10.3)
Chloride: 104 mmol/L (ref 98–111)
Creatinine, Ser: 0.39 mg/dL — ABNORMAL LOW (ref 0.44–1.00)
GFR, Estimated: >60 mL/min (ref >60)
Glucose, Bld: 115 mg/dL — ABNORMAL HIGH (ref 70–99)
Potassium: 3.7 mmol/L (ref 3.5–5.1)
Sodium: 133 mmol/L — ABNORMAL LOW (ref 135–145)

## 2023-05-09 LAB — TROPONIN I (HIGH SENSITIVITY)
Troponin I (High Sensitivity): 3 ng/L (ref <18)
Troponin I (High Sensitivity): 3 ng/L (ref <18)

## 2023-05-09 NOTE — Discharge Instructions (Signed)

## 2023-05-09 NOTE — ED Provider Notes (Signed)
[facility] Provider Note    Event Date/Time   First MD Initiated Contact with Patient 05/09/23 0236     (approximate)   History   Chest Pain  The patient and/or family speak(s) Spanish.  They understand they have the right to the use of a hospital interpreter, however at this time they prefer to speak directly with me in Spanish.  They know that they can ask for an interpreter at any time.   HPI Anne Floyd is a 67 y.o. female whose medical history includes hypertension and coronary artery disease for which she recently had a coronary artery CT.  She presents tonight for evaluation of chest pain.  She has been feeling for a few days and the pain seems to radiate from the left side of her chest into her back as well as to the left side of her neck and jaw.  The pain has been intermittent but persistent for the last few days but it seemed worse tonight and her blood pressure was higher than usual so she was concerned and wanted to get checked out.  Her discomfort is better at this time.  She has not been lightheaded or dizzy and has not passed out.  No shortness of breath, no nausea nor vomiting.     Physical Exam   Triage Vital Signs: ED Triage Vitals  Encounter Vitals Group     BP 05/09/23 0235 (!) 181/83     Systolic BP Percentile --      Diastolic BP Percentile --      Pulse Rate 05/09/23 0235 62     Resp 05/09/23 0235 16     Temp 05/09/23 0235 98.2 F (36.8 C)     Temp Source 05/09/23 0235 Oral     SpO2 05/09/23 0235 99 %     Weight 05/09/23 0234 79.4 kg (175 lb)     Height 05/09/23 0234 1.626 m (5\' 4" )     Head Circumference --      Peak Flow --      Pain Score 05/09/23 0234 3     Pain Loc --      Pain Education --      Exclude from Growth Chart --     Most recent vital signs: Vitals:   05/09/23 0400 05/09/23 0600  BP: (!) 166/73 131/71  Pulse: (!) 57 (!) 54  Resp: 18 13  Temp:    SpO2: 100% 99%    General: Awake, no  distress.  Generally well-appearing. CV:  Good peripheral perfusion.  Normal heart sounds, regular rate and rhythm Resp:  Normal effort. Speaking easily and comfortably, no accessory muscle usage nor intercostal retractions.  Lungs clear to auscultation. Abd:  No distention.  No tenderness to palpation of the abdomen.   ED Results / Procedures / Treatments   Labs (all labs ordered are listed, but only abnormal results are displayed) Labs Reviewed  BASIC METABOLIC PANEL - Abnormal; Notable for the following components:      Result Value   Sodium 133 (*)    CO2 21 (*)    Glucose, Bld 115 (*)    Creatinine, Ser 0.39 (*)    All other components within normal limits  CBC  TROPONIN I (HIGH SENSITIVITY)  TROPONIN I (HIGH SENSITIVITY)     EKG  ED ECG REPORT I, Loleta Rose, the attending physician, personally viewed and interpreted this ECG.  Date: 05/09/2023 EKG Time: 2:33 AM Rate: 60 Rhythm: normal  sinus rhythm QRS Axis: normal Intervals: normal ST/T Wave abnormalities: normal Narrative Interpretation: no evidence of acute ischemia    RADIOLOGY I viewed and interpreted the patient's chest x-ray.  I did not see any evidence of pneumonia but the radiologist pointed out an area on the left side that could represent artifact or a projected image or infection.  However the patient had no correlating abnormality on the CT scan from a couple of days ago.   PROCEDURES:  Critical Care performed: No  .1-3 Lead EKG Interpretation  Performed by: Loleta Rose, MD Authorized by: Loleta Rose, MD     Interpretation: abnormal     ECG rate:  58   ECG rate assessment: bradycardic     Rhythm: sinus bradycardia     Ectopy: none       IMPRESSION / MDM / ASSESSMENT AND PLAN / ED COURSE  I reviewed the triage vital signs and the nursing notes.                              Differential diagnosis includes, but is not limited to, Angina, ACS including unstable angina, less likely  pneumothorax, pneumonia, or PE or AAS.  Patient's presentation is most consistent with acute presentation with potential threat to life or bodily function.  Labs/studies ordered: 2 view chest x-ray, CBC, BMP, high-sensitivity troponin x 2  Interventions/Medications given:  Medications - No data to display  (Note:  hospital course my include additional interventions and/or labs/studies not listed above.)   Patient has had symptoms for 3 to 4 days but reportedly it was worse tonight.  I think it is reasonable to check 2 high-sensitivity troponins.  I reviewed the radiology report from her CT coronary artery study and it is generally reassuring with no critical findings.  Her initial high-sensitivity troponin is negative and her BMP and CBC are normal.  No evidence of ischemia on EKG.  I provided reassurance and will monitor her for the full "chest pain rule out" but anticipate discharge and outpatient follow-up if clinically she stays the same and her high-sensitivity troponins remain negative.  The patient is on the cardiac monitor to evaluate for evidence of arrhythmia and/or significant heart rate changes.   Clinical Course as of 05/09/23 0704  Mon May 09, 2023  0347 Reassessed patient.  She remains pain-free and has had no chest pain since coming to the emergency department.  Her blood pressure has normalized.  I had a long talk with her and her family member about the reassuring results and they are comfortable with the plan for discharge and outpatient follow-up.  I considered hospitalization, but given the relatively reassuring results on the recent coronary artery CT scan and her negative troponins tonight, I think she should be very appropriate for outpatient follow-up.  I gave my usual return precautions. [CF]    Clinical Course User Index [CF] Loleta Rose, MD     FINAL CLINICAL IMPRESSION(S) / ED DIAGNOSES   Final diagnoses:  Chest pain, unspecified type     Rx / DC  Orders   ED Discharge Orders     None        Note:  This document was prepared using Dragon voice recognition software and may include unintentional dictation errors.   Loleta Rose, MD 05/09/23 [phone]

## 2023-05-09 NOTE — ED Triage Notes (Addendum)
Pt arrived POV for CP that has been for a few days, pain radiates into her right jaw and down into the right side of her neck. Pt had a heart cath on 10/28 by [facility]) that reported 2 blockages, no stents at this time. Pt is waiting for a call for next appt to discus POC of blockages. Pt denies SOB, no n/v. EKG NSR at current, HTN noted, otherwise VSS, A&O x4. Pt is currently on Plavix 75 mg daily.

## 2023-05-16 DIAGNOSIS — I38 Endocarditis, valve unspecified: Secondary | ICD-10-CM | POA: Diagnosis not present

## 2023-05-16 DIAGNOSIS — E782 Mixed hyperlipidemia: Secondary | ICD-10-CM | POA: Diagnosis not present

## 2023-05-16 DIAGNOSIS — E871 Hypo-osmolality and hyponatremia: Secondary | ICD-10-CM | POA: Diagnosis not present

## 2023-05-16 DIAGNOSIS — I10 Essential (primary) hypertension: Secondary | ICD-10-CM | POA: Diagnosis not present

## 2023-05-16 DIAGNOSIS — I2510 Atherosclerotic heart disease of native coronary artery without angina pectoris: Secondary | ICD-10-CM | POA: Diagnosis not present

## 2023-05-23 DIAGNOSIS — M25532 Pain in left wrist: Secondary | ICD-10-CM | POA: Diagnosis not present

## 2023-05-23 DIAGNOSIS — M1711 Unilateral primary osteoarthritis, right knee: Secondary | ICD-10-CM | POA: Diagnosis not present

## 2023-05-23 DIAGNOSIS — E785 Hyperlipidemia, unspecified: Secondary | ICD-10-CM | POA: Diagnosis not present

## 2023-05-23 DIAGNOSIS — I10 Essential (primary) hypertension: Secondary | ICD-10-CM | POA: Diagnosis not present

## 2023-07-05 DIAGNOSIS — H524 Presbyopia: Secondary | ICD-10-CM | POA: Diagnosis not present

## 2023-07-14 DIAGNOSIS — I10 Essential (primary) hypertension: Secondary | ICD-10-CM | POA: Diagnosis not present

## 2023-08-16 DIAGNOSIS — R0683 Snoring: Secondary | ICD-10-CM | POA: Diagnosis not present

## 2023-08-16 DIAGNOSIS — M549 Dorsalgia, unspecified: Secondary | ICD-10-CM | POA: Diagnosis not present

## 2023-08-16 DIAGNOSIS — G8929 Other chronic pain: Secondary | ICD-10-CM | POA: Diagnosis not present

## 2023-08-16 DIAGNOSIS — H9313 Tinnitus, bilateral: Secondary | ICD-10-CM | POA: Diagnosis not present

## 2023-08-16 DIAGNOSIS — I10 Essential (primary) hypertension: Secondary | ICD-10-CM | POA: Diagnosis not present

## 2023-08-16 DIAGNOSIS — R42 Dizziness and giddiness: Secondary | ICD-10-CM | POA: Diagnosis not present

## 2023-08-22 DIAGNOSIS — G479 Sleep disorder, unspecified: Secondary | ICD-10-CM | POA: Diagnosis not present

## 2023-08-22 DIAGNOSIS — R053 Chronic cough: Secondary | ICD-10-CM | POA: Diagnosis not present

## 2023-08-22 DIAGNOSIS — R042 Hemoptysis: Secondary | ICD-10-CM | POA: Diagnosis not present

## 2023-08-22 DIAGNOSIS — G4719 Other hypersomnia: Secondary | ICD-10-CM | POA: Diagnosis not present

## 2023-08-22 DIAGNOSIS — M419 Scoliosis, unspecified: Secondary | ICD-10-CM | POA: Diagnosis not present

## 2023-09-09 DIAGNOSIS — L80 Vitiligo: Secondary | ICD-10-CM | POA: Diagnosis not present

## 2023-09-09 DIAGNOSIS — L821 Other seborrheic keratosis: Secondary | ICD-10-CM | POA: Diagnosis not present

## 2023-09-19 DIAGNOSIS — R7303 Prediabetes: Secondary | ICD-10-CM | POA: Diagnosis not present

## 2023-09-19 DIAGNOSIS — I10 Essential (primary) hypertension: Secondary | ICD-10-CM | POA: Diagnosis not present

## 2023-09-19 DIAGNOSIS — E782 Mixed hyperlipidemia: Secondary | ICD-10-CM | POA: Diagnosis not present

## 2023-09-26 DIAGNOSIS — I10 Essential (primary) hypertension: Secondary | ICD-10-CM | POA: Diagnosis not present

## 2023-09-26 DIAGNOSIS — Z599 Problem related to housing and economic circumstances, unspecified: Secondary | ICD-10-CM | POA: Diagnosis not present

## 2023-09-26 DIAGNOSIS — Z0000 Encounter for general adult medical examination without abnormal findings: Secondary | ICD-10-CM | POA: Diagnosis not present

## 2023-09-26 DIAGNOSIS — E119 Type 2 diabetes mellitus without complications: Secondary | ICD-10-CM | POA: Diagnosis not present

## 2023-09-26 DIAGNOSIS — R7989 Other specified abnormal findings of blood chemistry: Secondary | ICD-10-CM | POA: Diagnosis not present

## 2023-09-26 DIAGNOSIS — R7303 Prediabetes: Secondary | ICD-10-CM | POA: Diagnosis not present

## 2023-09-27 ENCOUNTER — Other Ambulatory Visit: Payer: Self-pay | Admitting: Family Medicine

## 2023-09-27 DIAGNOSIS — R7989 Other specified abnormal findings of blood chemistry: Secondary | ICD-10-CM

## 2023-10-05 ENCOUNTER — Ambulatory Visit
Admission: RE | Admit: 2023-10-05 | Discharge: 2023-10-05 | Disposition: A | Discharge status: Home / Self Care | Source: Ambulatory Visit | Attending: Family Medicine | Admitting: Family Medicine

## 2023-10-05 DIAGNOSIS — R7989 Other specified abnormal findings of blood chemistry: Secondary | ICD-10-CM | POA: Insufficient documentation

## 2023-10-12 ENCOUNTER — Other Ambulatory Visit: Payer: Self-pay | Admitting: Internal Medicine

## 2023-10-12 DIAGNOSIS — K828 Other specified diseases of gallbladder: Secondary | ICD-10-CM

## 2023-10-21 ENCOUNTER — Ambulatory Visit
Admission: RE | Admit: 2023-10-21 | Discharge: 2023-10-21 | Disposition: A | Discharge status: Home / Self Care | Source: Ambulatory Visit | Attending: Internal Medicine | Admitting: Internal Medicine

## 2023-10-21 DIAGNOSIS — K828 Other specified diseases of gallbladder: Secondary | ICD-10-CM | POA: Insufficient documentation

## 2023-10-21 MED ORDER — GADOBUTROL 1 MMOL/ML IV SOLN
7.5000 mL | Freq: Once | INTRAVENOUS | Status: AC | PRN
  Administered 2023-10-21: 7.5 mL via INTRAVENOUS

## 2023-11-10 ENCOUNTER — Other Ambulatory Visit: Payer: Self-pay | Admitting: Family Medicine

## 2023-11-10 ENCOUNTER — Ambulatory Visit
Admission: RE | Admit: 2023-11-10 | Discharge: 2023-11-10 | Disposition: A | Discharge status: Home / Self Care | Source: Ambulatory Visit | Attending: Family Medicine | Admitting: Family Medicine

## 2023-11-10 DIAGNOSIS — R590 Localized enlarged lymph nodes: Secondary | ICD-10-CM | POA: Diagnosis present

## 2023-11-10 MED ORDER — IOHEXOL 300 MG/ML  SOLN
100.0000 mL | Freq: Once | INTRAMUSCULAR | Status: AC | PRN
  Administered 2023-11-10: 100 mL via INTRAVENOUS

## 2023-11-22 ENCOUNTER — Ambulatory Visit: Payer: Self-pay | Admitting: General Surgery

## 2023-11-22 NOTE — H&P (Signed)
 History of Present Illness Anne Floyd is a 68 year old female who presents with a right groin mass.  She noticed the palpable right groin mass approximately one week ago. The mass is located in the right inguinal region and was identified as a 4.7 cm solid heterogeneous indeterminate enhancing mass on a CT scan of the pelvis with contrast performed on Nov 10, 2023.  I personally evaluated the images.  No associated symptoms such as fever, weight loss, or night sweats. There is slight discomfort when wearing tight pants due to pressure from the clothing, but no significant pain.  No pain radiation.  Patient cannot identify any alleviating factors.  No changes in her ability to walk, and her previous knee issues have resolved following therapy.  No past history of malignancy  She lives alone but has two daughters who live nearby and can assist her post-procedure if needed.      PAST MEDICAL HISTORY:  Past Medical History:  Diagnosis Date   Hypertension         PAST SURGICAL HISTORY:   Past Surgical History:  Procedure Laterality Date   CESAREAN SECTION     x5 last was 1999         MEDICATIONS:  Outpatient Encounter Medications as of 11/22/2023  Medication Sig Dispense Refill   aspirin 81 MG EC tablet Take 1 tablet (81 mg total) by mouth once daily     atorvastatin (LIPITOR) 80 MG tablet Take 1 tablet (80 mg total) by mouth once daily 90 tablet 1   garlic 1,000 mg Cap Take by mouth     hydroCHLOROthiazide (HYDRODIURIL) 12.5 MG tablet Take 1 tablet (12.5 mg total) by mouth once daily 30 tablet 3   losartan (COZAAR) 25 MG tablet Take 1 tablet (25 mg total) by mouth once daily 90 tablet 1   metFORMIN  (GLUCOPHAGE ) 1000 MG tablet Take 500 mg by mouth 2 (two) times daily with meals     propranoloL  (INDERAL ) 40 MG tablet Take 1 tablet (40 mg total) by mouth 2 (two) times daily 180 tablet 1   tacrolimus (PROTOPIC) 0.1 % ointment      turmeric root extract 500 mg Cap Take  by mouth     UNABLE TO FIND Blood pressure support- heart health supplement     vitamin B complex/folic acid (B COMPLEX 100 ORAL) Take by mouth     No facility-administered encounter medications on file as of 11/22/2023.     ALLERGIES:   Patient has no known allergies.   SOCIAL HISTORY:  Social History   Socioeconomic History   Marital status: Divorced  Tobacco Use   Smoking status: Never    Passive exposure: Never   Smokeless tobacco: Never  Vaping Use   Vaping status: Never Used  Substance and Sexual Activity   Alcohol use: Not Currently   Drug use: Not Currently   Sexual activity: Not Currently   Social Drivers of Health   Financial Resource Strain: High Risk (11/22/2023)   Overall Financial Resource Strain (CARDIA)    Difficulty of Paying Living Expenses: Hard  Food Insecurity: No Food Insecurity (11/22/2023)   Hunger Vital Sign    Worried About Running Out of Food in the Last Year: Never true    Ran Out of Food in the Last Year: Never true  Transportation Needs: Unmet Transportation Needs (11/22/2023)   PRAPARE - Administrator, Civil Service (Medical): Yes    Lack of Transportation (Non-Medical): No  FAMILY HISTORY:  No family history on file.   GENERAL REVIEW OF SYSTEMS:   General ROS: negative for - chills, fatigue, fever, weight gain or weight loss Allergy and Immunology ROS: negative for - hives  Hematological and Lymphatic ROS: negative for - bleeding problems or bruising, negative for palpable nodes Endocrine ROS: negative for - heat or cold intolerance, hair changes Respiratory ROS: negative for - cough, shortness of breath or wheezing Cardiovascular ROS: no chest pain or palpitations GI ROS: negative for nausea, vomiting, abdominal pain, diarrhea, constipation Musculoskeletal ROS: negative for - joint swelling or muscle pain Neurological ROS: negative for - confusion, syncope Dermatological ROS: negative for pruritus and rash  PHYSICAL  EXAM:  Vitals:   11/22/23 0811  BP: (!) 141/74  Pulse: 59  .  Ht:152.4 cm (5') Wt:83.5 kg (184 lb) UYQ:IHKV surface area is 1.88 meters squared. Body mass index is 35.94 kg/m.Aaron Aas   GENERAL: Alert, active, oriented x3  HEENT: Pupils equal reactive to light. Extraocular movements are intact. Sclera clear. Palpebral conjunctiva normal red color.Pharynx clear.  NECK: Supple with no palpable mass and no adenopathy.  LUNGS: Sound clear with no rales rhonchi or wheezes.  HEART: Regular rhythm S1 and S2 without murmur.  ABDOMEN: Soft and depressible, nontender with no palpable mass, no hepatomegaly.   EXTREMITIES: Well-developed well-nourished symmetrical with no dependent edema.  Right groin palpable mass, tender to palpation.  No skin changes and above the palpable mass.  NEUROLOGICAL: Awake alert oriented, facial expression symmetrical, moving all extremities.   Results RADIOLOGY Pelvic CT with contrast: 4.7 cm solid heterogeneous indeterminate enhancing mass in the right inguinal region, possible pathologic adenopathy versus soft tissue mass (11/10/2023)    Assessment & Plan Right inguinal mass   A palpable right inguinal mass was detected approximately one week ago. A CT scan of the pelvis with contrast on Nov 10, 2023, revealed a 4.7 cm solid heterogeneous indeterminate enhancing mass in the right inguinal region. The differential diagnosis includes pathologic adenopathy versus a soft tissue mass. She reports mild discomfort with tight clothing but no significant pain or associated symptoms such as fever, weight loss, or night sweats. There is no history of prior inguinal surgery. The mass could be benign or malignant, with the potential for non-aggressive cancer (low grade lymphoma) not requiring further treatment post-excision. Surgical excision is preferred over core biopsy for definitive diagnosis and treatment, as it allows complete removal and accurate diagnosis, avoiding a second  procedure if biopsy is inconclusive. Concerns about surgery spreading cancer were addressed, clarifying that surgery does not cause cancer to spread. Advised on post-operative care: minimal restrictions, use of ice, and pain management with acetaminophen, ibuprofen, or prescribed stronger analgesics if needed. Ensure she has someone to stay with her for the first 24 hours post-surgery due to potential effects of anesthesia. Discuss potential outcomes post-excision, including further treatment if the mass is malignant.    Lymphadenopathy, inguinal [R59.0]          Patient verbalized understanding, all questions were answered, and were agreeable with the plan outlined above.   Eldred Grego, MD  Electronically signed by Eldred Grego, MD

## 2023-11-25 ENCOUNTER — Inpatient Hospital Stay: Admission: RE | Admit: 2023-11-25 | Source: Ambulatory Visit

## 2023-11-28 ENCOUNTER — Encounter: Admission: RE | Payer: Self-pay | Source: Home / Self Care

## 2023-11-28 ENCOUNTER — Ambulatory Visit: Admission: RE | Admit: 2023-11-28 | Source: Home / Self Care | Admitting: General Surgery

## 2023-11-28 SURGERY — BIOPSY, LYMPH NODE, SENTINEL
Anesthesia: General | Site: Inguinal | Laterality: Right

## 2024-01-10 ENCOUNTER — Other Ambulatory Visit: Payer: Self-pay | Admitting: Neurology

## 2024-01-10 DIAGNOSIS — R42 Dizziness and giddiness: Secondary | ICD-10-CM

## 2024-01-10 DIAGNOSIS — H9313 Tinnitus, bilateral: Secondary | ICD-10-CM

## 2024-02-02 ENCOUNTER — Ambulatory Visit
Admission: RE | Admit: 2024-02-02 | Discharge: 2024-02-02 | Disposition: A | Discharge status: Home / Self Care | Source: Ambulatory Visit | Attending: Neurology | Admitting: Neurology

## 2024-02-02 DIAGNOSIS — H9313 Tinnitus, bilateral: Secondary | ICD-10-CM | POA: Insufficient documentation

## 2024-02-02 DIAGNOSIS — R42 Dizziness and giddiness: Secondary | ICD-10-CM | POA: Insufficient documentation

## 2024-03-02 ENCOUNTER — Other Ambulatory Visit: Payer: Self-pay | Admitting: Obstetrics and Gynecology

## 2024-03-02 DIAGNOSIS — Z1231 Encounter for screening mammogram for malignant neoplasm of breast: Secondary | ICD-10-CM

## 2024-08-02 NOTE — Congregational Nurse Program (Signed)
" °  Dept: [phone]   Congregational Nurse Program Note  Date of Encounter: 08/02/2024  Patient not understanding what the doctor's orders.  I reviewed chart and explained with interpreter what the doctor's orders and helped patient to understand.  Patient acknowledged understanding.   Patient asked for some 2x2 gauze for wound on abdomen.  Past Medical History: Past Medical History:  Diagnosis Date   Diabetes mellitus without complication (HCC)    Hyperlipidemia    Hypertension    Lactose intolerance     Encounter Details:  Community Questionnaire - 08/02/24 1146       Questionnaire   Ask client: Do you give verbal consent for me to treat you today? Yes    Student Assistance N/A    [facility]    Population Status Housed    Insurance [employer]    Insurance/Financial Assistance Referral N/A    Medication N/A    Medical Provider Yes    Medical Referrals Made NA    Medical Appointment Completed N/A    Screenings CN Performed (remember to also record results) NA    CN Interventions Reviewed New Diagnosis;Other Education;Reviewed D/C Planning    ED Visit Averted N/A            "
